# Patient Record
Sex: Male | Born: 1990 | Race: White | Hispanic: No | Marital: Single | Smoking: Current every day smoker
Health system: Southern US, Community
[De-identification: ages and names within clinical notes are randomized; demographics above are authoritative.]

## PROBLEM LIST (undated history)

## (undated) DIAGNOSIS — F909 Attention-deficit hyperactivity disorder, unspecified type: Secondary | ICD-10-CM

## (undated) DIAGNOSIS — F1994 Other psychoactive substance use, unspecified with psychoactive substance-induced mood disorder: Secondary | ICD-10-CM

## (undated) DIAGNOSIS — T7840XA Allergy, unspecified, initial encounter: Secondary | ICD-10-CM

## (undated) HISTORY — DX: Other psychoactive substance use, unspecified with psychoactive substance-induced mood disorder: F19.94

## (undated) HISTORY — DX: Allergy, unspecified, initial encounter: T78.40XA

---
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
## 2009-05-05 ENCOUNTER — Telehealth: Payer: Self-pay | Admitting: *Deleted

## 2009-05-06 ENCOUNTER — Telehealth: Payer: Self-pay | Admitting: Internal Medicine

## 2009-06-27 ENCOUNTER — Ambulatory Visit: Payer: Self-pay | Admitting: Internal Medicine

## 2009-06-27 ENCOUNTER — Telehealth: Payer: Self-pay | Admitting: Internal Medicine

## 2009-07-12 ENCOUNTER — Ambulatory Visit: Payer: Self-pay | Admitting: Internal Medicine

## 2009-07-12 ENCOUNTER — Telehealth: Payer: Self-pay | Admitting: Internal Medicine

## 2010-02-16 ENCOUNTER — Emergency Department (HOSPITAL_COMMUNITY): Admission: EM | Admit: 2010-02-16 | Discharge: 2010-02-16 | Payer: Self-pay | Admitting: Emergency Medicine

## 2010-03-08 ENCOUNTER — Ambulatory Visit (HOSPITAL_COMMUNITY): Admission: RE | Admit: 2010-03-08 | Discharge: 2010-03-08 | Payer: Self-pay | Admitting: Psychiatry

## 2010-03-21 ENCOUNTER — Ambulatory Visit (HOSPITAL_COMMUNITY): Payer: Self-pay | Admitting: Marriage and Family Therapist

## 2010-03-30 ENCOUNTER — Ambulatory Visit (HOSPITAL_COMMUNITY): Payer: Self-pay | Admitting: Marriage and Family Therapist

## 2010-04-04 ENCOUNTER — Ambulatory Visit (HOSPITAL_COMMUNITY): Payer: Self-pay | Admitting: Marriage and Family Therapist

## 2010-04-27 ENCOUNTER — Ambulatory Visit: Payer: Self-pay | Admitting: Internal Medicine

## 2010-04-27 DIAGNOSIS — R042 Hemoptysis: Secondary | ICD-10-CM | POA: Insufficient documentation

## 2010-04-27 DIAGNOSIS — J019 Acute sinusitis, unspecified: Secondary | ICD-10-CM

## 2010-06-19 ENCOUNTER — Ambulatory Visit (HOSPITAL_COMMUNITY): Payer: Self-pay | Admitting: Marriage and Family Therapist

## 2010-07-17 ENCOUNTER — Ambulatory Visit (HOSPITAL_COMMUNITY)
Admission: RE | Admit: 2010-07-17 | Discharge: 2010-07-17 | Payer: Self-pay | Source: Home / Self Care | Attending: Marriage and Family Therapist | Admitting: Marriage and Family Therapist

## 2010-08-08 NOTE — Assessment & Plan Note (Signed)
Summary: coughing up blood/deb/njr   Vital Signs:  Patient profile:   20 year old male Height:      72 inches Weight:      133 pounds BMI:     18.10 Temp:     98.2 degrees F oral Pulse rate:   60 / minute BP sitting:   120 / 70  (right arm) Cuff size:   regular  Vitals Entered By: Romualdo Bolk, CMA (AAMA) (April 27, 2010 11:20 AM) CC: Pt states that he was coughing up blood last night around 10pm. Pt is still having some coughing and congestion. Pt states that he is not having sob. A few days ago he had a sore throat. No fever.   History of Present Illness: Glen Wilson comes in today  with father for acute visit  for blodd dark in [place] last pm although ceearer to green today. Has had a chet head cold for about a week and then at work at [employer] had a left sided nose bleed that stopped in 5 monutes  without other signs .  THat evening had blood in mucous.  reports no fever or sob  or wheezing. uses daily MJ almost.  NO ha. has had chronic left sided nasal congestion for a while.   as a child used nasal steroids with ? some help . says he allergy  year round  Preventive Screening-Counseling & Management  Alcohol-Tobacco     Alcohol drinks/day: 0     Smoking Status: current     Packs/Day: 0.25  Caffeine-Diet-Exercise     Caffeine use/day: 5+     Does Patient Exercise: yes     Type of exercise: run  Current Medications (verified): 1)  None  Allergies (verified): No Known Drug Allergies  Past History:  Past medical, surgical, family and social histories (including risk factors) reviewed for relevance to current acute and chronic problems.  Past Medical History: Allergic rhinitis ADHD Underweight chronic marijuana use   Past Surgical History: Reviewed history from 02/19/2007 and no changes required. Denies surgical history  Past History:  Care Management: Dermatology: [place]  Family History: Reviewed history from 03/31/2008 and no changes  required. adopted  Social History: Reviewed history from 06/27/2009 and no changes required. school  senior at [employer] . No plans yet for next year.   sweet tea     works at [employer]    recently 14   hours .  hh of 3 dog  Fa ocass outside tobacco.  sleep  1 pm and up till 11 am.    ocass alcohol.   screen time on weekend .   Review of Systems  The patient denies anorexia, fever, weight loss, weight gain, vision loss, decreased hearing, hoarseness, chest pain, syncope, peripheral edema, headaches, abdominal pain, melena, hematochezia, severe indigestion/heartburn, transient blindness, difficulty walking, enlarged lymph nodes, and angioedema.    Physical Exam  General:  congested in nad .alert and well-hydrated.   Head:  normocephalic and atraumatic.   Eyes:  clear  no discharge    slight redness left  Ears:  R ear normal, L ear normal, and no external deformities.   Nose:  no external deformity and no external erythema.  left nostril with very swollen turbinate and mucoid discharge     obstructed and no blood or perf.  face non tender Mouth:  midl erythema  no lesions Neck:  No deformities, masses, or tenderness noted. Lungs:  Normal respiratory effort, chest expands symmetrically. Lungs are  clear to auscultation, no crackles or wheezes. Heart:  Normal rate and regular rhythm. S1 and S2 normal without gallop, murmur, click, rub or other extra sounds. Pulses:  nl cap refill  Neurologic:  non focal alert & oriented X3.   Skin:  turgor normal, color normal, no ecchymoses, and no petechiae.   Cervical Nodes:  No lymphadenopathy noted Psych:  Oriented X3, normally interactive, and good eye contact.     Impression & Recommendations:  Problem # 1:  SINUSITIS - ACUTE-NOS (ICD-461.9) left  / acute on chronic .  patient says he has chronic stuffiness for years on lthe left?  .,    very swollen tubinate today and no lesion seen otherwise .     rec see ent if continuing  obstructive signs  The  following medications were removed from the medication list:    Minocin 100 Mg Caps (Minocycline hcl) .Marland Kitchen... 1 by mouth once daily His updated medication list for this problem includes:    Amoxicillin-pot Clavulanate 875-125 Mg Tabs (Amoxicillin-pot clavulanate) .Marland Kitchen... 1 by mouth two times a day for sinsusitis  Problem # 2:  HEMOPTYSIS UNSPECIFIED (ICD-786.30) seems to be upper  airway related secondary to nose bleed previously     rec against tob MJ use  pt aware  . if continuing consider chest x ray etc.   Complete Medication List: 1)  Amoxicillin-pot Clavulanate 875-125 Mg Tabs (Amoxicillin-pot clavulanate) .Marland Kitchen.. 1 by mouth two times a day for sinsusitis  Patient Instructions: 1)  after treatment for your left sided sinusitis   if still nasally clogged on that side  then call and  we can set up ENT  appt.  2)  Saline nose spray.    to moisturize nose   in the meantime. 3)  Possible benefit  of nasal cortisone also  for chronic  rhinitis  4)  call if bloody phegm continues also. Prescriptions: AMOXICILLIN-POT CLAVULANATE 875-125 MG TABS (AMOXICILLIN-POT CLAVULANATE) 1 by mouth two times a day for sinsusitis  #20 x 0   Entered and Authorized by:   Madelin Headings MD   Signed by:   Madelin Headings MD on 04/27/2010   Method used:   Electronically to        [facility] [address] (retail)       [phone]       [place], [place]  [address]       Ph: [phone]       Fax: [phone]   RxID:   [id number]    Orders Added: 1)  Est. Patient Level IV [72536]

## 2010-08-08 NOTE — Progress Notes (Signed)
Summary: Pts mom called re: sons depression. URGENT-please triage  Phone Note Call from Patient Call back at [phone]    Caller: mom-Carol Summary of Call: Pts mom called and said that patient is very depressed. Nothing has improved.Pts mom would like a call to discuss.  Initial call taken by: Lucy Antigua,  July 12, 2009 8:18 AM  Follow-up for Phone Call        11:30 appt given.  Patient is not on med.  Has been seen recently.  No change or improvement. Follow-up by: Rudy Jew, RN,  July 12, 2009 8:53 AM

## 2010-08-08 NOTE — Assessment & Plan Note (Signed)
Summary: DEPRESSION/PS   Vital Signs:  Patient profile:   20 year old male Weight:      130 pounds Pulse rate:   66 / minute BP sitting:   100 / 60  (right arm) Cuff size:   regular  Vitals Entered By: Romualdo Bolk, CMA (AAMA) (July 12, 2009 11:44 AM) CC: Discuss depression-the other day pt was caught smoking marjuania. Pt is very unhappy and can't get thoughts together. Pt states that he is not sucidial. He can't get things together., Depression   History of Present Illness: Glen Wilson come sin today for   as an add on   emergent work in for above.    Mom is with him and states she is worried he may be depressed.  Teen  says could be also and willing to  address this.  See last  OV.   Mom looking for direction of what to do.     They do have appt with Dr Donell Beers  next week  but concerned what to do in the meantime.   Recently got caought with friends with marijuana.      Depression History:      The patient is having a depressed mood most of the day and has a diminished interest in his usual daily activities.  Positive alarm features for depression include feelings of worthlessness (guilt) and impaired concentration (indecisiveness).  However, he denies significant weight loss, significant weight gain, insomnia, hypersomnia, psychomotor agitation, psychomotor retardation, fatigue (loss of energy), and recurrent thoughts of death or suicide.  Positive alarm features for a manic disorder include persistently & abnormally elevated mood, abnormally & persistently irritable mood, talkative or feels need to keep talking, and distractibility.  He denies less need for sleep, flight of ideas, increase in goal-directed activity, psychomotor agitation, inflated self-esteem or grandiosity, excessive buying sprees, excessive sexual indiscretions, and excessive foolish business investments.        Suicide risk questions reveal that he does not feel like life is worth living.  The patient denies  that he wishes that he were dead and denies that he has thought about ending his life.         Preventive Screening-Counseling & Management  Alcohol-Tobacco     Alcohol drinks/day: 0     Smoking Status: current     Packs/Day: 0.25  Caffeine-Diet-Exercise     Caffeine use/day: 5+     Does Patient Exercise: yes     Type of exercise: run  Current Medications (verified): 1)  Minocin 100 Mg Caps (Minocycline Hcl) .Marland Kitchen.. 1 By Mouth Once Daily 2)  Tazorac 0.05 % Gel (Tazarotene)  Allergies (verified): No Known Drug Allergies  Past History:  Past medical, surgical, family and social histories (including risk factors) reviewed, and no changes noted (except as noted below).  Past Medical History: Reviewed history from 06/27/2009 and no changes required. Allergic rhinitis ADHD Underweight  Past Surgical History: Reviewed history from 02/19/2007 and no changes required. Denies surgical history  Family History: Reviewed history from 03/31/2008 and no changes required. adopted  Social History: Reviewed history from 06/27/2009 and no changes required. school  senior at [employer] . No plans yet for next year.   sweet tea     works at [employer]    recently 14   hours .  hh of 3 dog  Fa ocass ouside tobacco.  sleep  1 pm and up till 11 am.    ocass alcohol.   screen time on  weekend .   Review of Systems  The patient denies anorexia, fever, weight loss, chest pain, syncope, dyspnea on exertion, peripheral edema, prolonged cough, abdominal pain, melena, hematochezia, severe indigestion/heartburn, difficulty walking, unusual weight change, abnormal bleeding, enlarged lymph nodes, and angioedema.         see previous note   Physical Exam  General:  Well-developed,well-nourished,in no acute distress; alert,appropriate and cooperative throughout examination Head:  normocephalic and atraumatic.   Pulses:  nl cap refill  Neurologic:  non focal and no tremors Psych:  Oriented X3, normally  interactive, good eye contact, and not anxious appearing.     Impression & Recommendations:  Problem # 1:  ? of ADJ DISORDER WITH MIXED ANXIETY & DEPRESSED MOOD (ICD-309.28) SAt rsisk and disc  my rec of seeing psychiatry for med consult .     however  if wishes in the mean time can add  samples of lexapro with risk discussed   .  rx for adhd may also help but there is comorbid dx possible.      Also  rec make a list of various meds he has been on over the years and perceptino of efficacy and side effect profile., this should  expedite appropriate choise of intervention.   Problem # 2:  ? of BEHAVIOR PROBLEM, ADULT (ICD-V40.9) see above   Problem # 3:  ADHD (ICD-314.01) not on meds   and beginning school next week . says daytrana no hlp in past but mom feels it helped.    Complete Medication List: 1)  Minocin 100 Mg Caps (Minocycline hcl) .Marland Kitchen.. 1 by mouth once daily 2)  Tazorac 0.05 % Gel (Tazarotene) 3)  Lexapro 10 Mg Tabs (Escitalopram oxalate) .Marland Kitchen.. 1 by mouth   as directed.  Patient Instructions: 1)  keep appt next week. 2)  if you wish can start lexapro 10 mg  take 1/2 by mouth once daily and increase to 1 by mouth once daily  . 3)  call if  needed.   Psychiatrist may change medication  . greater than 50% of visit spent in counseling  and face to face.  30 minutes .

## 2010-09-22 LAB — BASIC METABOLIC PANEL
BUN: 9 mg/dL (ref 6–23)
Creatinine, Ser: 0.96 mg/dL (ref 0.4–1.5)
GFR calc Af Amer: >60 mL/min (ref >60)
GFR calc non Af Amer: >60 mL/min (ref >60)
Potassium: 3.7 mEq/L (ref 3.5–5.1)

## 2010-09-22 LAB — DIFFERENTIAL
Basophils Absolute: 0 10*3/uL (ref 0.0–0.1)
Lymphocytes Relative: 39 % (ref 12–46)
Lymphs Abs: 2.2 10*3/uL (ref 0.7–4.0)
Neutro Abs: 2.7 10*3/uL (ref 1.7–7.7)
Neutrophils Relative %: 50 % (ref 43–77)

## 2010-09-22 LAB — TRICYCLICS SCREEN, URINE: TCA Scrn: NOT DETECTED

## 2010-09-22 LAB — CBC
Platelets: 245 10*3/uL (ref 150–400)
RDW: 11.9 % (ref 11.5–15.5)
WBC: 5.5 10*3/uL (ref 4.0–10.5)

## 2010-09-22 LAB — RAPID URINE DRUG SCREEN, HOSP PERFORMED: Amphetamines: NOT DETECTED

## 2010-09-22 LAB — ETHANOL: Alcohol, Ethyl (B): <5 mg/dL (ref 0–10)

## 2011-09-29 ENCOUNTER — Emergency Department (HOSPITAL_COMMUNITY)
Admission: EM | Admit: 2011-09-29 | Discharge: 2011-09-29 | Disposition: A | Discharge status: Home / Self Care | Payer: No Typology Code available for payment source | Attending: Emergency Medicine | Admitting: Emergency Medicine

## 2011-09-29 ENCOUNTER — Encounter (HOSPITAL_COMMUNITY): Payer: Self-pay

## 2011-09-29 ENCOUNTER — Emergency Department (HOSPITAL_COMMUNITY): Payer: No Typology Code available for payment source

## 2011-09-29 DIAGNOSIS — S161XXA Strain of muscle, fascia and tendon at neck level, initial encounter: Secondary | ICD-10-CM

## 2011-09-29 DIAGNOSIS — F1210 Cannabis abuse, uncomplicated: Secondary | ICD-10-CM | POA: Insufficient documentation

## 2011-09-29 DIAGNOSIS — S139XXA Sprain of joints and ligaments of unspecified parts of neck, initial encounter: Secondary | ICD-10-CM | POA: Insufficient documentation

## 2011-09-29 DIAGNOSIS — M25519 Pain in unspecified shoulder: Secondary | ICD-10-CM | POA: Insufficient documentation

## 2011-09-29 DIAGNOSIS — Y92410 Unspecified street and highway as the place of occurrence of the external cause: Secondary | ICD-10-CM | POA: Insufficient documentation

## 2011-09-29 DIAGNOSIS — R079 Chest pain, unspecified: Secondary | ICD-10-CM | POA: Insufficient documentation

## 2011-09-29 DIAGNOSIS — M549 Dorsalgia, unspecified: Secondary | ICD-10-CM | POA: Insufficient documentation

## 2011-09-29 DIAGNOSIS — M542 Cervicalgia: Secondary | ICD-10-CM | POA: Insufficient documentation

## 2011-09-29 HISTORY — DX: Attention-deficit hyperactivity disorder, unspecified type: F90.9

## 2011-09-29 MED ORDER — CYCLOBENZAPRINE HCL 10 MG PO TABS: 10.0000 mg | ORAL_TABLET | Freq: Two times a day (BID) | ORAL | Status: AC | PRN

## 2011-09-29 MED ORDER — IBUPROFEN 600 MG PO TABS: 600.0000 mg | ORAL_TABLET | Freq: Four times a day (QID) | ORAL | Status: AC | PRN
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
## 2011-09-29 NOTE — ED Provider Notes (Signed)
Medical screening examination/treatment/procedure(s) were performed by non-physician practitioner and as supervising physician I was immediately available for consultation/collaboration.   Josuha Fontanez, MD 09/29/11 2308 
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
## 2012-02-20 ENCOUNTER — Ambulatory Visit: Payer: BC Managed Care – PPO

## 2012-04-05 ENCOUNTER — Encounter (HOSPITAL_BASED_OUTPATIENT_CLINIC_OR_DEPARTMENT_OTHER): Payer: Self-pay | Admitting: *Deleted

## 2012-04-05 ENCOUNTER — Emergency Department (HOSPITAL_BASED_OUTPATIENT_CLINIC_OR_DEPARTMENT_OTHER)
Admission: EM | Admit: 2012-04-05 | Discharge: 2012-04-06 | Disposition: A | Discharge status: Home / Self Care | Payer: BC Managed Care – PPO | Attending: Emergency Medicine | Admitting: Emergency Medicine

## 2012-04-05 DIAGNOSIS — S61209A Unspecified open wound of unspecified finger without damage to nail, initial encounter: Secondary | ICD-10-CM | POA: Insufficient documentation

## 2012-04-05 DIAGNOSIS — F17200 Nicotine dependence, unspecified, uncomplicated: Secondary | ICD-10-CM | POA: Insufficient documentation

## 2012-04-05 DIAGNOSIS — F909 Attention-deficit hyperactivity disorder, unspecified type: Secondary | ICD-10-CM | POA: Insufficient documentation

## 2012-04-05 DIAGNOSIS — W268XXA Contact with other sharp object(s), not elsewhere classified, initial encounter: Secondary | ICD-10-CM | POA: Insufficient documentation

## 2012-04-05 DIAGNOSIS — S61019A Laceration without foreign body of unspecified thumb without damage to nail, initial encounter: Secondary | ICD-10-CM

## 2012-04-05 NOTE — ED Provider Notes (Signed)
History  This chart was scribed for Glen Medaglia Smitty Cords, MD by Shari Heritage. The patient was seen in room MH05/MH05. Patient's care was started at 2157.     CSN: [id number]  Arrival date & time 04/05/12  2157   First MD Initiated Contact with Patient 04/05/12 2358      Chief Complaint  Patient presents with  . Laceration     Patient is a 21 y.o. male presenting with skin laceration. The history is provided by the patient. No language interpreter was used.  Laceration  The incident occurred 3 to 5 hours ago. The laceration is located on the right hand. The laceration is 3 cm (3 cm) in size. The laceration mechanism was a broken glass. The pain is moderate. The pain has been constant since onset. He reports no foreign bodies present. His tetanus status is UTD.    HPI Comments: Glen Wilson is a 21 y.o. male who presents to the Emergency Department complaining of a laceration with associated moderate, constant pain to right thumb onset 3-4 hours ago. Patient states that he was washing dishes when he cut his thumb on broken glass. Patient applied dressing to the wound PTA. He denies any other symptoms at this time. Patient's last Tdap was at 17 years when he was accepted to college. He is a current everyday smoker.  Past Medical History  Diagnosis Date  . Attention deficit disorder with hyperactivity     History reviewed. No pertinent past surgical history.  History reviewed. No pertinent family history.  History  Substance Use Topics  . Smoking status: Current Every Day Smoker  . Smokeless tobacco: Not on file  . Alcohol Use: Yes      Review of Systems  Skin: Positive for wound.  All other systems reviewed and are negative.    Allergies  Review of patient's allergies indicates no known allergies.  Home Medications   Current Outpatient Rx  Name Route Sig Dispense Refill  . TETRAHYDROZOLINE HCL 0.05 % OP SOLN Both Eyes Place 1 drop into both eyes 2 (two) times  daily.      BP 126/70  Pulse 86  Temp 98.3 F (36.8 C) (Oral)  Resp 16  Ht 6\' 2"  (1.88 m)  Wt 130 lb (58.968 kg)  BMI 16.69 kg/m2  Physical Exam  Constitutional: He is oriented to person, place, and time. He appears well-developed and well-nourished.  HENT:  Head: Normocephalic and atraumatic.  Mouth/Throat: Oropharynx is clear and moist. No oropharyngeal exudate.  Eyes: EOM are normal. Pupils are equal, round, and reactive to light.  Neck: Normal range of motion. Neck supple.  Cardiovascular: Normal rate, regular rhythm and normal heart sounds.   No murmur heard. Pulmonary/Chest: Effort normal and breath sounds normal. No respiratory distress. He has no wheezes. He has no rales.  Abdominal: Soft. Bowel sounds are normal. There is no tenderness.  Musculoskeletal:       Right hand: He exhibits laceration. normal sensation noted.       Hands:      3 cm vertical laceration with flap, on tuft  Neurological: He is alert and oriented to person, place, and time.  Psychiatric: He has a normal mood and affect. His behavior is normal.    ED Course  Procedures (including critical care time) COORDINATION OF CARE: 12:03am- Patient informed of current plan for treatment and evaluation and agrees with plan at this time.  Patient tolerance: Patient tolerated the procedure well with no immediate complications.  Labs Reviewed - No data to display  Dg Finger Thumb Right  04/06/2012  *RADIOLOGY REPORT*  Clinical Data: Cut right thumb with glass.  RIGHT THUMB 2+V  Comparison: None.  Findings: Three views of the right thumb were obtained.  There is no evidence for a radiopaque foreign body.  Negative for acute fracture or dislocation.   Soft tissue abnormality along the tip of the thumb.  IMPRESSION: Soft tissue abnormality along the tip of the thumb is most compatible with the area of laceration.  No definite radiopaque foreign body.  No acute bony abnormality.   Original Report  Authenticated By: Richarda Overlie, M.D.      No diagnosis found.    MDM  LACERATION REPAIR Performed by: Jasmine Awe Authorized by: Jasmine Awe Consent: Verbal consent obtained. Risks and benefits: risks, benefits and alternatives were discussed Consent given by: patient Patient identity confirmed: provided demographic data Prepped and Draped in normal sterile fashion Wound explored  Laceration Location: medial right thumb  Laceration Length: 3 cm  No Foreign Bodies seen or palpated  Anesthesia: local infiltration  Local anesthetic: lidocaine 2%   Anesthetic total: 3 ml digital block  Irrigation method: syringe Amount of cleaning: extensive  Skin closure: 5.0 ethilon  Number of sutures: 10  Technique: interrupted, complex trimming of skin and undermining of edges  Patient tolerance: Patient tolerated the procedure well with no immediate complications.  Return for streaking bleeding or any complications.   I personally performed the services described in this documentation, which was scribed in my presence. The recorded information has been reviewed and considered.     Yoanna Jurczyk Smitty Cords, MD 04/06/12 0157

## 2012-04-05 NOTE — ED Notes (Signed)
Pt states he cut his right thumb on a glass while washing dishes.. Pressure dressing applied PTA. Bleeding controlled.

## 2012-04-06 ENCOUNTER — Emergency Department (HOSPITAL_BASED_OUTPATIENT_CLINIC_OR_DEPARTMENT_OTHER): Payer: BC Managed Care – PPO

## 2012-04-06 MED ORDER — TRAMADOL HCL 50 MG PO TABS: 50.0000 mg | ORAL_TABLET | Freq: Four times a day (QID) | ORAL | Status: DC | PRN

## 2012-04-06 NOTE — ED Notes (Signed)
MD at bedside. 

## 2012-04-06 NOTE — ED Notes (Signed)
Peroxide/NS soak x 10 minutes per Dr. Magdalene Molly.

## 2012-04-06 NOTE — ED Notes (Signed)
D/c home with ride and rx x 1 for tramadol

## 2012-04-17 ENCOUNTER — Ambulatory Visit: Payer: BC Managed Care – PPO | Admitting: Internal Medicine

## 2012-04-21 ENCOUNTER — Encounter: Payer: Self-pay | Admitting: Internal Medicine

## 2012-04-21 ENCOUNTER — Ambulatory Visit (INDEPENDENT_AMBULATORY_CARE_PROVIDER_SITE_OTHER): Payer: BC Managed Care – PPO | Admitting: Internal Medicine

## 2012-04-21 VITALS — BP 116/84 | HR 74 | Temp 98.7°F | Wt 131.0 lb

## 2012-04-21 DIAGNOSIS — S61219A Laceration without foreign body of unspecified finger without damage to nail, initial encounter: Secondary | ICD-10-CM

## 2012-04-21 DIAGNOSIS — S61209A Unspecified open wound of unspecified finger without damage to nail, initial encounter: Secondary | ICD-10-CM

## 2012-04-21 DIAGNOSIS — Z4802 Encounter for removal of sutures: Secondary | ICD-10-CM

## 2012-04-21 NOTE — Progress Notes (Signed)
  Subjective:    Patient ID: Glen Wilson, male    DOB: 1990-07-10, 21 y.o.   MRN: [id number]  HPI Pt comesin today with fatehr for suture remopval and wound evaluation. Last visit was 2 years ago  About 16 days ago glass cut right thumb trying to catch it, had 10 sutures placed . No other injury.  No sig numbness except at tip no bleeding or dc. 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   Review of Systems No fever swelling    Objective:   Physical Exam BP 116/84  Pulse 74  Temp 98.7 F (37.1 C) (Oral)  Wt 131 lb (59.421 kg)  SpO2 99% WDWN in nad   Alert  Right thumb with healling laceration without infection some crusting at tip . No redness  And no tenderness   10 sutures removed without complication or bleeding dressed with antibiotic ointment and  Bandaid.      Assessment & Plan:   fingetr laceration  Suture removal wound check.   Expectant management. Disc injury prevention etc .

## 2012-04-21 NOTE — Patient Instructions (Addendum)
keep wound clean gently  Antibiotic ointment ok .  Recheck as needed or when due

## 2012-07-22 NOTE — Progress Notes (Signed)
This encounter was created in error - please disregard.

## 2013-03-27 IMAGING — CR DG FINGER THUMB 2+V*R*
3 series · 3 of 3 positions shown · non-contrast
Comparison: None.

CLINICAL DATA: Cut right thumb with glass.

RIGHT THUMB 2+V

[x finger pa right]
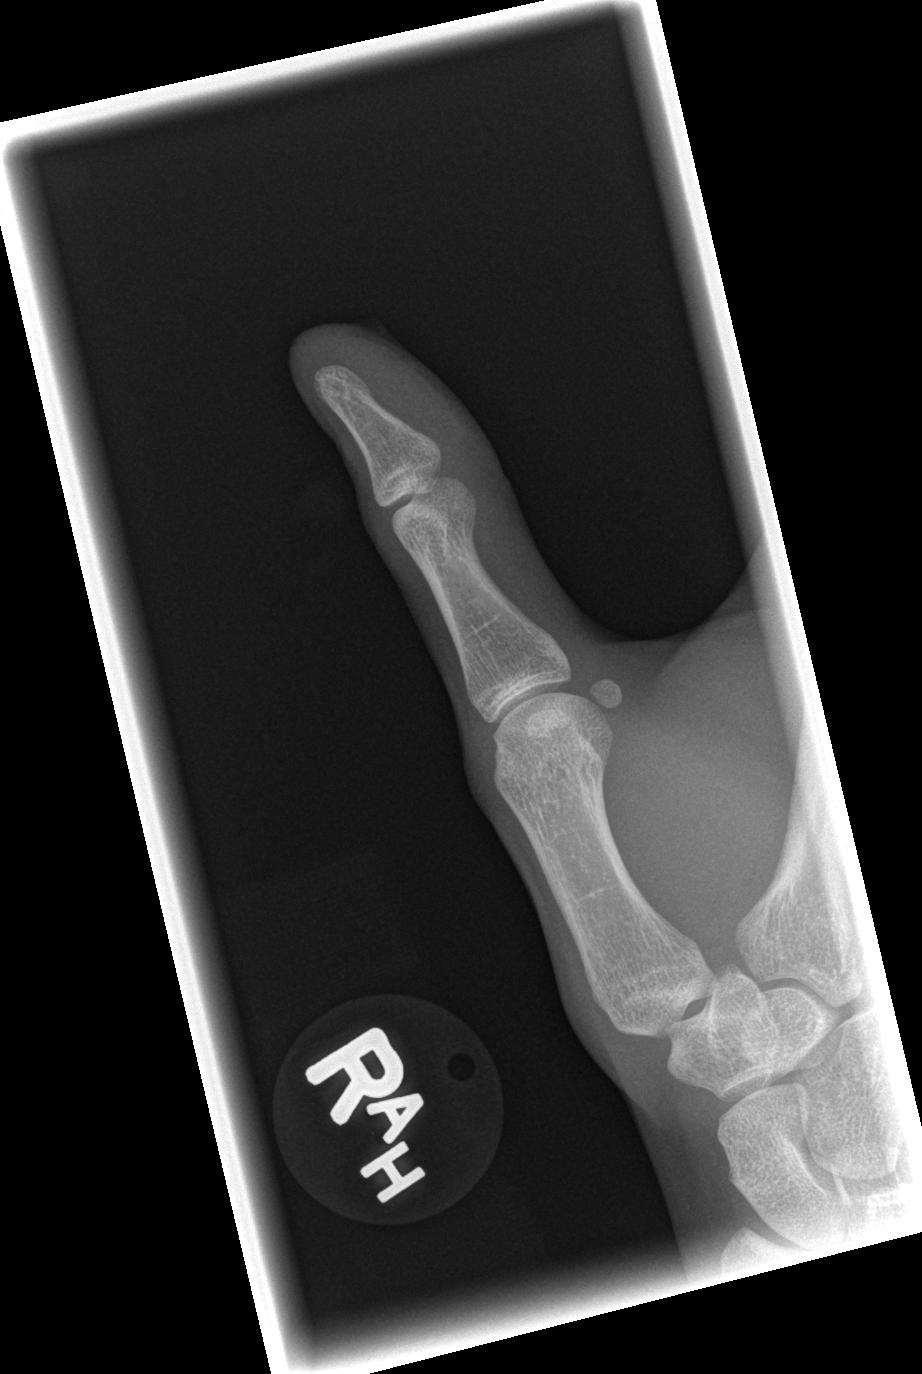

[x finger obl. right]
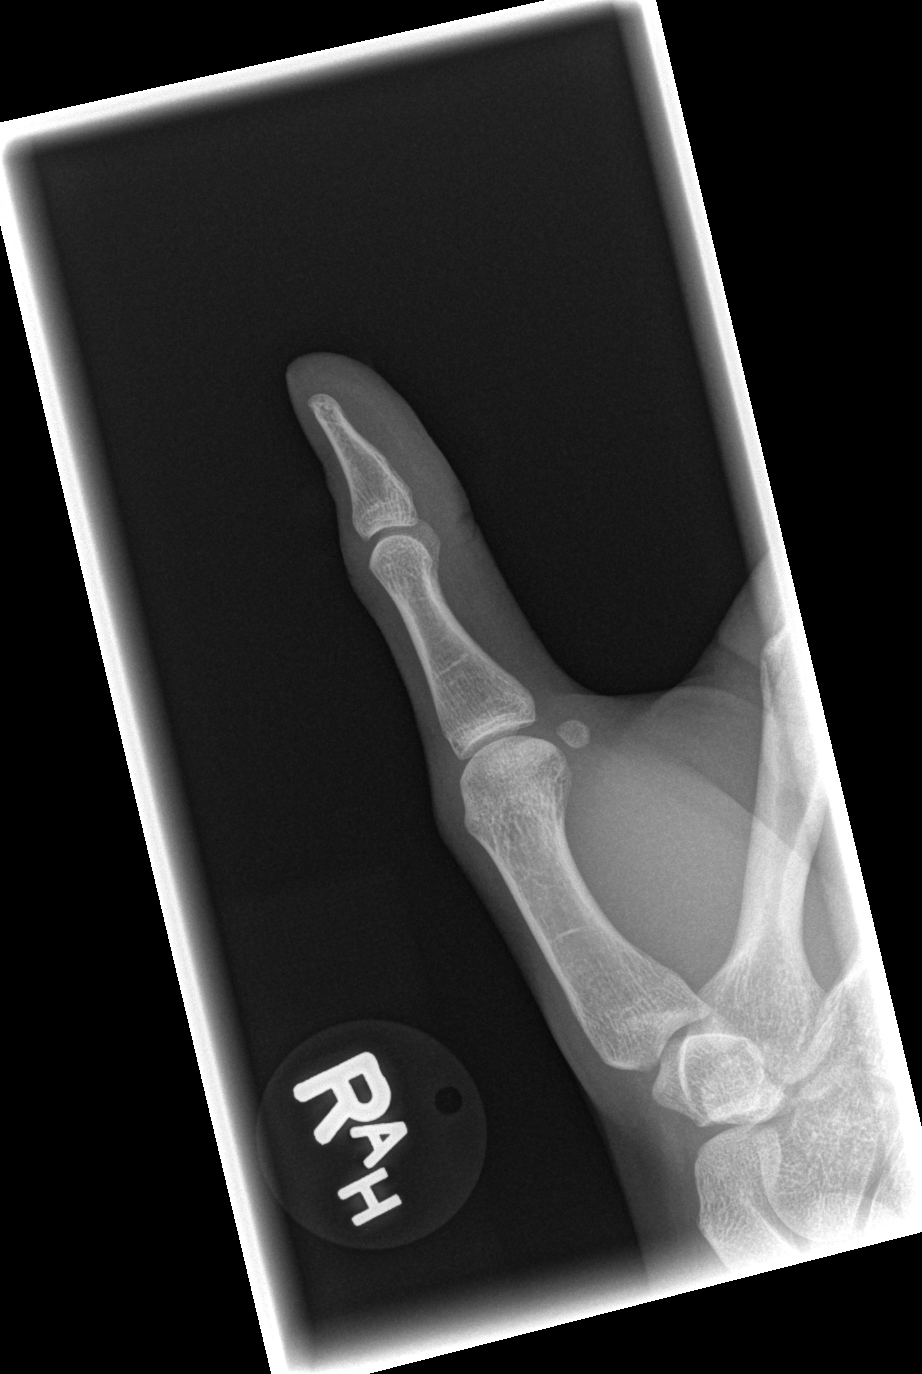

[x finger lateral right]
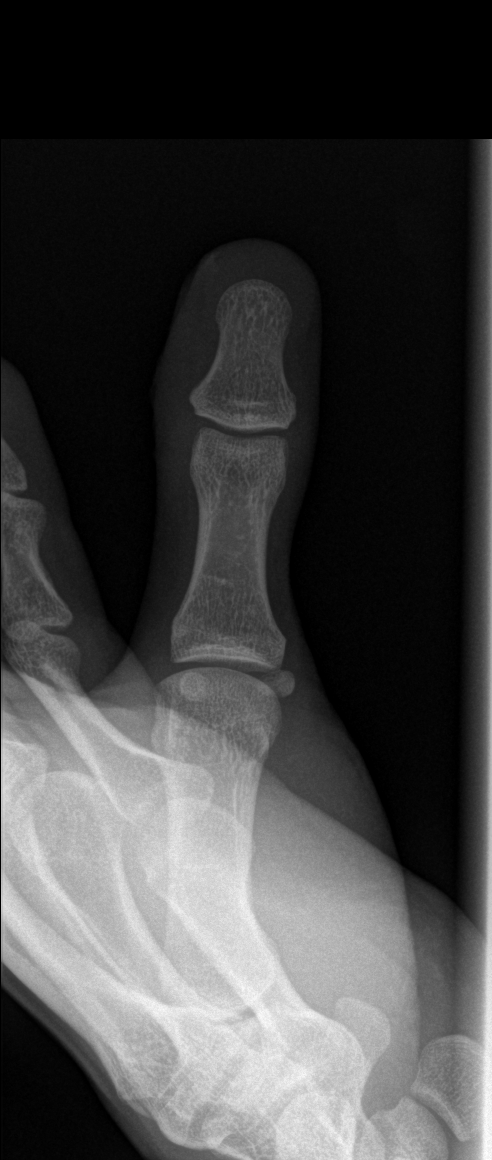

[3 of 3 positions shown; findings below may reference images not displayed]

FINDINGS: Three views of the right thumb were obtained.  There is
no evidence for a radiopaque foreign body.  Negative for acute
fracture or dislocation.   Soft tissue abnormality along the tip of
the thumb.
IMPRESSION: Soft tissue abnormality along the tip of the thumb is most
compatible with the area of laceration.  No definite radiopaque
foreign body.

No acute bony abnormality.

## 2013-09-02 ENCOUNTER — Other Ambulatory Visit: Payer: BC Managed Care – PPO

## 2013-09-04 ENCOUNTER — Other Ambulatory Visit: Payer: BC Managed Care – PPO

## 2013-09-07 ENCOUNTER — Other Ambulatory Visit (INDEPENDENT_AMBULATORY_CARE_PROVIDER_SITE_OTHER): Payer: BC Managed Care – PPO

## 2013-09-07 DIAGNOSIS — Z0000 Encounter for general adult medical examination without abnormal findings: Secondary | ICD-10-CM

## 2013-09-07 LAB — CBC WITH DIFFERENTIAL/PLATELET
Basophils Absolute: 0 10*3/uL (ref 0.0–0.1)
Basophils Relative: 0.4 % (ref 0.0–3.0)
EOS PCT: 3.4 % (ref 0.0–5.0)
Eosinophils Absolute: 0.2 10*3/uL (ref 0.0–0.7)
HCT: 49 % (ref 39.0–52.0)
Hemoglobin: 16.3 g/dL (ref 13.0–17.0)
Lymphocytes Relative: 47.7 % — ABNORMAL HIGH (ref 12.0–46.0)
Lymphs Abs: 3.1 10*3/uL (ref 0.7–4.0)
MCHC: 33.3 g/dL (ref 30.0–36.0)
MCV: 98.3 fl (ref 78.0–100.0)
MONO ABS: 0.6 10*3/uL (ref 0.1–1.0)
Monocytes Relative: 9.4 % (ref 3.0–12.0)
NEUTROS PCT: 39.1 % — AB (ref 43.0–77.0)
Neutro Abs: 2.5 10*3/uL (ref 1.4–7.7)
PLATELETS: 263 10*3/uL (ref 150.0–400.0)
RBC: 4.98 Mil/uL (ref 4.22–5.81)
RDW: 13.4 % (ref 11.5–14.6)
WBC: 6.5 10*3/uL (ref 4.5–10.5)

## 2013-09-07 LAB — HEPATIC FUNCTION PANEL
ALBUMIN: 4 g/dL (ref 3.5–5.2)
ALK PHOS: 66 U/L (ref 39–117)
ALT: 22 U/L (ref 0–53)
AST: 23 U/L (ref 0–37)
Bilirubin, Direct: 0.1 mg/dL (ref 0.0–0.3)
TOTAL PROTEIN: 7.2 g/dL (ref 6.0–8.3)
Total Bilirubin: 0.7 mg/dL (ref 0.3–1.2)

## 2013-09-07 LAB — LIPID PANEL
CHOLESTEROL: 128 mg/dL (ref 0–200)
HDL: 49.6 mg/dL (ref >39.00)
LDL CALC: 64 mg/dL (ref 0–99)
TRIGLYCERIDES: 71 mg/dL (ref 0.0–149.0)
Total CHOL/HDL Ratio: 3
VLDL: 14.2 mg/dL (ref 0.0–40.0)

## 2013-09-07 LAB — BASIC METABOLIC PANEL
BUN: 16 mg/dL (ref 6–23)
CALCIUM: 9.3 mg/dL (ref 8.4–10.5)
CHLORIDE: 105 meq/L (ref 96–112)
CO2: 27 mEq/L (ref 19–32)
CREATININE: 1.1 mg/dL (ref 0.4–1.5)
GFR: 91.74 mL/min (ref >60.00)
Glucose, Bld: 84 mg/dL (ref 70–99)
Potassium: 3.6 mEq/L (ref 3.5–5.1)
Sodium: 139 mEq/L (ref 135–145)

## 2013-09-07 LAB — TSH: TSH: 1.12 u[IU]/mL (ref 0.35–5.50)

## 2013-09-08 ENCOUNTER — Encounter: Payer: Self-pay | Admitting: Internal Medicine

## 2013-09-08 ENCOUNTER — Ambulatory Visit (INDEPENDENT_AMBULATORY_CARE_PROVIDER_SITE_OTHER): Payer: BC Managed Care – PPO | Admitting: Internal Medicine

## 2013-09-08 VITALS — BP 126/72 | Temp 97.8°F | Ht 72.5 in | Wt 139.0 lb

## 2013-09-08 DIAGNOSIS — Z113 Encounter for screening for infections with a predominantly sexual mode of transmission: Secondary | ICD-10-CM

## 2013-09-08 DIAGNOSIS — J069 Acute upper respiratory infection, unspecified: Secondary | ICD-10-CM

## 2013-09-08 DIAGNOSIS — Z0000 Encounter for general adult medical examination without abnormal findings: Secondary | ICD-10-CM

## 2013-09-08 DIAGNOSIS — B9789 Other viral agents as the cause of diseases classified elsewhere: Secondary | ICD-10-CM

## 2013-09-08 NOTE — Patient Instructions (Addendum)
Working memory can be permanently  affected by chornic thc exposure  Binge drinking is also unhealthy . Will notify you  of labs when available.  Marijuana Abuse and Chemical Dependency WHEN IS DRUG USE A PROBLEM? Problems related to drug use usually begin with abuse of the substance and lead to dependency.  Abuse is repeated use of a drug with recurrent and significant negative consequences. Abuse happens anytime drug use is interfering with normal living activities including:   Failure to fulfill major obligations at work, school or home (poor work Systems analyst, missing work or school and/or neglecting children and home).  Engaging in activities that are physically dangerous (driving a car or doing recreational activities such as swimming or rock climbing) while under the effects of the drug.  Recurrent drug-related legal problems (arrests for disorderly conduct or assault and battery).  Recurrent social or interpersonal problems caused or increased by the effects of the drug (arguments with family or friends, or physical fights). Dependency has two parts.   You first develop an emotional/psychological dependence. Psychological dependence develops when your mind tells you that the drug is needed. You come to believe it helps you cope with life.  This is usually followed by physical dependence which has developed when continuing increases of drugs are required to get the same feeling or "high." This may result in:  Withdrawal symptoms such as shakes or tremors.  The substance being over a longer period of time than intended.  An ongoing desire, or unsuccessful effort to, cut down or control the use.  Greater amounts of time spent getting the drug, using the drug or recovering from the effects of the drug.  Important social, work or interests and activities are given up or reduced because or drug use.  Substance is used despite knowledge of ongoing physical (ulcers) or psychological  (depression) problems. SIGNS OF CHEMICAL DEPENDENCY:  Friends or family say there is a problem.  Fighting when using drugs.  Having blackouts (not remembering what you do while using).  Feel sick from using drugs but continue using.  Lie about use or amounts of drugs used.  Need drugs to get you going.  Need drugs to relate to people or feel comfortable in social situations.  Use drugs to forget problems. A "yes" answered to any of the above signs of chemical dependency indicates there are problems. The longer the use of drugs continues, the greater the problems will become. If there is a family history of drug or alcohol use it is best not to experiment with drugs. Experimentation leads to tolerance. Addiction is followed by dependency where drugs are now needed not just to get high but to feel normal. Addiction cannot be cured but it can be stopped. This often requires outside help and the care of professionals. Treatment centers are listed in the yellow pages under: Cocaine, Narcotics, and Alcoholics anonymous. Most hospitals and clinics can refer you to a specialized care center. WHAT IS MARIJUANA? Marijuana is a plant which grows wild all over the world. The plant contains many chemicals but the active ingredient of the plant is THC (tetrahydrocannabinol). This is responsible for the "high" perceived by people using the drug. HOW IS MARIJUANA USED? Marijuana is smoked, eaten in brownies or any other food, and drank as a tea. WHAT ARE THE EFFECTS OF MARIJUANA? Marijuana is a nervous system depressant which slows the thinking process. Because of this effect, users think marijuana has a calming effect. Actually what happens is the air  carrying tubules in the lung become relaxed and allow more oxygen to enter. This causes the user to feel high. The blood pressure falls so less blood reaches the brain and the heart speeds up. As the effects wear off the user becomes depressed. Some people  become very paranoid during use. They feel as though people are out to get them. Periodic use can interfere with performance at school or work. Generally Marijuana use does not develop into a physical dependence, but it is very habit forming. Marijuana is also seen as a gateway to use of harder drugs. Strong habits such as using Marijuana, as with all drugs and addictions, can only be helped by stopping use of all chemicals. This is hard but may save your life.  OTHER HEALTH RISKS OF MARIJUANA AND DRUG USE ARE: The increased possibility of getting AIDS or hepatitis (liver inflammation).  HOW TO STAY DRUG FREE ONCE YOU HAVE QUIT USING:  Develop healthy activities and form friends who do not use drugs.  Stay away from the drug scene.  Tell the those who want you to use drugs you have other, better things to do.  Have ready excuses available about why you cannot use.  Attend 12-Step Meetings for support from other recovering people. FOR MORE HELP OR INFORMATION CONTACT YOUR LOCAL CAREGIVER, CLINIC, [place]. Document Released: 06/22/2000 Document Revised: 10/20/2012 Document Reviewed: 07/23/2007 [facility] Patient Information 2014 [place]. Marijuana Abuse Your exam shows you have used marijuana or pot. There are many health problems related to marijuana abuse. These include:  Bronchitis.  Chronic cough.  Emphysema.  Lung and upper airway cancer. Abusers also experience impairment in:  Memory.  Judgment.  Ability to learn.  Coordination. Students who smoke marijuana:  Get lower grades.  Are less likely to graduate than those who do not. Adults who abuse marijuana:  Have problems at work.  May even lose their jobs due to:  Poor work Systems analyst.  Absenteeism. Attention, memory, and learning skills have been shown to be diminished for up to 6 months after stopping regular use, and there is evidence that the effects can be cumulative over a lifetime.  Heavier use  of marijuana also puts a strain on relationships with friends and loved ones and can lead to moodiness and loss of confidence. Acute intoxication can lead to:  Increased anxiety.  A panic episode. It also increases the risk for having an automobile accident. This is especially true if the pot is combined with alcohol or other intoxicants. Treatment for acute intoxication is rarely needed. However, medicine to reduce anxiety may be helpful in some people. Millions of people are considered to be dependent on marijuana. It is long-term regular use that leads to addiction and all of its complex problems. Information on the problem of addiction and the health problems of long-term abuse is posted at the [facility] for Drug Abuse website, motorcyclefax.com. Consult with your doctor or counselor if you want further information and support in handling this common problem. Document Released: 08/02/2004 Document Revised: 09/17/2011 Document Reviewed: 05/20/2007 [facility] Patient Information 2014 [employer], [place].   Preventive Care for Adults, Male A healthy lifestyle and preventive care can promote health and wellness. Preventive health guidelines for men include the following key practices:  A routine yearly physical is a good way to check with your health care provider about your health and preventative screening. It is a chance to share any concerns and updates on your health and to receive a thorough exam.  Visit your dentist for a routine exam and preventative care every 6 months. Brush your teeth twice a day and floss once a day. Good oral hygiene prevents tooth decay and gum disease.  The frequency of eye exams is based on your age, health, family medical history, use of contact lenses, and other factors. Follow your health care provider's recommendations for frequency of eye exams.  Eat a healthy diet. Foods such as vegetables, fruits, whole grains, low-fat dairy products, and lean protein  foods contain the nutrients you need without too many calories. Decrease your intake of foods high in solid fats, added sugars, and salt. Eat the right amount of calories for you.Get information about a proper diet from your health care provider, if necessary.  Regular physical exercise is one of the most important things you can do for your health. Most adults should get at least 150 minutes of moderate-intensity exercise (any activity that increases your heart rate and causes you to sweat) each week. In addition, most adults need muscle-strengthening exercises on 2 or more days a week.  Maintain a healthy weight. The body mass index (BMI) is a screening tool to identify possible weight problems. It provides an estimate of body fat based on height and weight. Your health care provider can find your BMI and can help you achieve or maintain a healthy weight.For adults 20 years and older:  A BMI below 18.5 is considered underweight.  A BMI of 18.5 to 24.9 is normal.  A BMI of 25 to 29.9 is considered overweight.  A BMI of 30 and above is considered obese.  Maintain normal blood lipids and cholesterol levels by exercising and minimizing your intake of saturated fat. Eat a balanced diet with plenty of fruit and vegetables. Blood tests for lipids and cholesterol should begin at age 5 and be repeated every 5 years. If your lipid or cholesterol levels are high, you are over 50, or you are at high risk for heart disease, you may need your cholesterol levels checked more frequently.Ongoing high lipid and cholesterol levels should be treated with medicines if diet and exercise are not working.  If you smoke, find out from your health care provider how to quit. If you do not use tobacco, do not start.  Lung cancer screening is recommended for adults aged 64 80 years who are at high risk for developing lung cancer because of a history of smoking. A yearly low-dose CT scan of the lungs is recommended for  people who have at least a 30-pack-year history of smoking and are a current smoker or have quit within the past 15 years. A pack year of smoking is smoking an average of 1 pack of cigarettes a day for 1 year (for example: 1 pack a day for 30 years or 2 packs a day for 15 years). Yearly screening should continue until the smoker has stopped smoking for at least 15 years. Yearly screening should be stopped for people who develop a health problem that would prevent them from having lung cancer treatment.  If you choose to drink alcohol, do not have more than 2 drinks per day. One drink is considered to be 12 ounces (355 mL) of beer, 5 ounces (148 mL) of wine, or 1.5 ounces (44 mL) of liquor.  Avoid use of street drugs. Do not share needles with anyone. Ask for help if you need support or instructions about stopping the use of drugs.  High blood pressure causes heart disease and increases  the risk of stroke. Your blood pressure should be checked at least every 1 2 years. Ongoing high blood pressure should be treated with medicines, if weight loss and exercise are not effective.  If you are 68 24 years old, ask your health care provider if you should take aspirin to prevent heart disease.  Diabetes screening involves taking a blood sample to check your fasting blood sugar level. This should be done once every 3 years, after age 38, if you are within normal weight and without risk factors for diabetes. Testing should be considered at a younger age or be carried out more frequently if you are overweight and have at least 1 risk factor for diabetes.  Colorectal cancer can be detected and often prevented. Most routine colorectal cancer screening begins at the age of 62 and continues through age 70. However, your health care provider may recommend screening at an earlier age if you have risk factors for colon cancer. On a yearly basis, your health care provider may provide home test kits to check for hidden blood  in the stool. Use of a small camera at the end of a tube to directly examine the colon (sigmoidoscopy or colonoscopy) can detect the earliest forms of colorectal cancer. Talk to your health care provider about this at age 33, when routine screening begins. Direct exam of the colon should be repeated every 5 10 years through age 64, unless early forms of precancerous polyps or small growths are found.  People who are at an increased risk for hepatitis B should be screened for this virus. You are considered at high risk for hepatitis B if:  You were born in a country where hepatitis B occurs often. Talk with your health care provider about which countries are considered high-risk.  Your parents were born in a high-risk country and you have not received a shot to protect against hepatitis B (hepatitis B vaccine).  You have HIV or AIDS.  You use needles to inject street drugs.  You live with, or have sex with, someone who has hepatitis B.  You are a man who has sex with other men (MSM).  You get hemodialysis treatment.  You take certain medicines for conditions such as cancer, organ transplantation, and autoimmune conditions.  Hepatitis C blood testing is recommended for all people born from [birth year] through 1966 and any individual with known risks for hepatitis C.  Practice safe sex. Use condoms and avoid high-risk sexual practices to reduce the spread of sexually transmitted infections (STIs). STIs include gonorrhea, chlamydia, syphilis, trichomonas, herpes, HPV, and human immunodeficiency virus (HIV). Herpes, HIV, and HPV are viral illnesses that have no cure. They can result in disability, cancer, and death.  A one-time screening for abdominal aortic aneurysm (AAA) and surgical repair of large AAAs by ultrasound are recommended for men ages 79 to 37 years who are current or former smokers.  Healthy men should no longer receive prostate-specific antigen (PSA) blood tests as part of routine  cancer screening. Talk with your health care provider about prostate cancer screening.  Testicular cancer screening is not recommended for adult males who have no symptoms. Screening includes self-exam, a health care provider exam, and other screening tests. Consult with your health care provider about any symptoms you have or any concerns you have about testicular cancer.  Use sunscreen. Apply sunscreen liberally and repeatedly throughout the day. You should seek shade when your shadow is shorter than you. Protect yourself by wearing long sleeves, pants,  a wide-brimmed hat, and sunglasses year round, whenever you are outdoors.  Once a month, do a whole-body skin exam, using a mirror to look at the skin on your back. Tell your health care provider about new moles, moles that have irregular borders, moles that are larger than a pencil eraser, or moles that have changed in shape or color.  Stay current with required vaccines (immunizations).  Influenza vaccine. All adults should be immunized every year.  Tetanus, diphtheria, and acellular pertussis (Td, Tdap) vaccine. An adult who has not previously received Tdap or who does not know his vaccine status should receive 1 dose of Tdap. This initial dose should be followed by tetanus and diphtheria toxoids (Td) booster doses every 10 years. Adults with an unknown or incomplete history of completing a 3-dose immunization series with Td-containing vaccines should begin or complete a primary immunization series including a Tdap dose. Adults should receive a Td booster every 10 years.  Varicella vaccine. An adult without evidence of immunity to varicella should receive 2 doses or a second dose if he has previously received 1 dose.  Human papillomavirus (HPV) vaccine. Males aged 47 21 years who have not received the vaccine previously should receive the 3-dose series. Males aged 50 26 years may be immunized. Immunization is recommended through the age of 78  years for any male who has sex with males and did not get any or all doses earlier. Immunization is recommended for any person with an immunocompromised condition through the age of 24 years if he did not get any or all doses earlier. During the 3-dose series, the second dose should be obtained 4 8 weeks after the first dose. The third dose should be obtained 24 weeks after the first dose and 16 weeks after the second dose.  Zoster vaccine. One dose is recommended for adults aged 76 years or older unless certain conditions are present.  Measles, mumps, and rubella (MMR) vaccine. Adults born before 45 generally are considered immune to measles and mumps. Adults born in [birth year] or later should have 1 or more doses of MMR vaccine unless there is a contraindication to the vaccine or there is laboratory evidence of immunity to each of the three diseases. A routine second dose of MMR vaccine should be obtained at least 28 days after the first dose for students attending postsecondary schools, health care workers, or international travelers. People who received inactivated measles vaccine or an unknown type of measles vaccine during 1963 1967 should receive 2 doses of MMR vaccine. People who received inactivated mumps vaccine or an unknown type of mumps vaccine before 1979 and are at high risk for mumps infection should consider immunization with 2 doses of MMR vaccine. Unvaccinated health care workers born before [birth year] who lack laboratory evidence of measles, mumps, or rubella immunity or laboratory confirmation of disease should consider measles and mumps immunization with 2 doses of MMR vaccine or rubella immunization with 1 dose of MMR vaccine.  Pneumococcal 13-valent conjugate (PCV13) vaccine. When indicated, a person who is uncertain of his immunization history and has no record of immunization should receive the PCV13 vaccine. An adult aged 7 years or older who has certain medical conditions and has not been  previously immunized should receive 1 dose of PCV13 vaccine. This PCV13 should be followed with a dose of pneumococcal polysaccharide (PPSV23) vaccine. The PPSV23 vaccine dose should be obtained at least 8 weeks after the dose of PCV13 vaccine. An adult aged 90 years or older  who has certain medical conditions and previously received 1 or more doses of PPSV23 vaccine should receive 1 dose of PCV13. The PCV13 vaccine dose should be obtained 1 or more years after the last PPSV23 vaccine dose.  Pneumococcal polysaccharide (PPSV23) vaccine. When PCV13 is also indicated, PCV13 should be obtained first. All adults aged 25 years and older should be immunized. An adult younger than age 36 years who has certain medical conditions should be immunized. Any person who resides in a nursing home or long-term care facility should be immunized. An adult smoker should be immunized. People with an immunocompromised condition and certain other conditions should receive both PCV13 and PPSV23 vaccines. People with human immunodeficiency virus (HIV) infection should be immunized as soon as possible after diagnosis. Immunization during chemotherapy or radiation therapy should be avoided. Routine use of PPSV23 vaccine is not recommended for American Indians, [place], or people younger than 65 years unless there are medical conditions that require PPSV23 vaccine. When indicated, people who have unknown immunization and have no record of immunization should receive PPSV23 vaccine. One-time revaccination 5 years after the first dose of PPSV23 is recommended for people aged 7 64 years who have chronic kidney failure, nephrotic syndrome, asplenia, or immunocompromised conditions. People who received 1 2 doses of PPSV23 before age 83 years should receive another dose of PPSV23 vaccine at age 53 years or later if at least 5 years have passed since the previous dose. Doses of PPSV23 are not needed for people immunized with PPSV23 at or  after age 24 years.  Meningococcal vaccine. Adults with asplenia or persistent complement component deficiencies should receive 2 doses of quadrivalent meningococcal conjugate (MenACWY-D) vaccine. The doses should be obtained at least 2 months apart. Microbiologists working with certain meningococcal bacteria, [place] recruits, people at risk during an outbreak, and people who travel to or live in countries with a high rate of meningitis should be immunized. A first-year college student up through age 10 years who is living in a residence hall should receive a dose if he did not receive a dose on or after his 16th birthday. Adults who have certain high-risk conditions should receive one or more doses of vaccine.  Hepatitis A vaccine. Adults who wish to be protected from this disease, have certain high-risk conditions, work with hepatitis A-infected animals, work in hepatitis A research labs, or travel to or work in countries with a high rate of hepatitis A should be immunized. Adults who were previously unvaccinated and who anticipate close contact with an international adoptee during the first 60 days after arrival in the [place] from a country with a high rate of hepatitis A should be immunized.  Hepatitis B vaccine. Adults who wish to be protected from this disease, have certain high-risk conditions, may be exposed to blood or other infectious body fluids, are household contacts or sex partners of hepatitis B positive people, are clients or workers in certain care facilities, or travel to or work in countries with a high rate of hepatitis B should be immunized.  Haemophilus influenzae type b (Hib) vaccine. A previously unvaccinated person with asplenia or sickle cell disease or having a scheduled splenectomy should receive 1 dose of Hib vaccine. Regardless of previous immunization, a recipient of a hematopoietic stem cell transplant should receive a 3-dose series 6 12 months after his successful  transplant. Hib vaccine is not recommended for adults with HIV infection. Preventive Service / Frequency Ages 75 to 21  Blood pressure check.** /  Every 1 to 2 years.  Lipid and cholesterol check.** / Every 5 years beginning at age 65.  Hepatitis C blood test.** / For any individual with known risks for hepatitis C.  Skin self-exam. / Monthly.  Influenza vaccine. / Every year.  Tetanus, diphtheria, and acellular pertussis (Tdap, Td) vaccine.** / Consult your health care provider. 1 dose of Td every 10 years.  Varicella vaccine.** / Consult your health care provider.  HPV vaccine. / 3 doses over 6 months, if 71 or younger.  Measles, mumps, rubella (MMR) vaccine.** / You need at least 1 dose of MMR if you were born in 1957 or later. You may also need a second dose.  Pneumococcal 13-valent conjugate (PCV13) vaccine.** / Consult your health care provider.  Pneumococcal polysaccharide (PPSV23) vaccine.** / 1 to 2 doses if you smoke cigarettes or if you have certain conditions.  Meningococcal vaccine.** / 1 dose if you are age 80 to 41 years and a Market researcher living in a residence hall, or have one of several medical conditions. You may also need additional booster doses.  Hepatitis A vaccine.** / Consult your health care provider.  Hepatitis B vaccine.** / Consult your health care provider.  Haemophilus influenzae type b (Hib) vaccine.** / Consult your health care provider. Ages 58 to 81  Blood pressure check.** / Every 1 to 2 years.  Lipid and cholesterol check.** / Every 5 years beginning at age 50.  Lung cancer screening. / Every year if you are aged 17 80 years and have a 30-pack-year history of smoking and currently smoke or have quit within the past 15 years. Yearly screening is stopped once you have quit smoking for at least 15 years or develop a health problem that would prevent you from having lung cancer treatment.  Fecal occult blood test (FOBT) of stool.  / Every year beginning at age 48 and continuing until age 28. You may not have to do this test if you get a colonoscopy every 10 years.  Flexible sigmoidoscopy** or colonoscopy.** / Every 5 years for a flexible sigmoidoscopy or every 10 years for a colonoscopy beginning at age 44 and continuing until age 9.  Hepatitis C blood test.** / For all people born from [birth year] through 1966 and any individual with known risks for hepatitis C.  Skin self-exam. / Monthly.  Influenza vaccine. / Every year.  Tetanus, diphtheria, and acellular pertussis (Tdap/Td) vaccine.** / Consult your health care provider. 1 dose of Td every 10 years.  Varicella vaccine.** / Consult your health care provider.  Zoster vaccine.** / 1 dose for adults aged 80 years or older.  Measles, mumps, rubella (MMR) vaccine.** / You need at least 1 dose of MMR if you were born in 1958 or later. You may also need a second dose.  Pneumococcal 13-valent conjugate (PCV13) vaccine.** / Consult your health care provider.  Pneumococcal polysaccharide (PPSV23) vaccine.** / 1 to 2 doses if you smoke cigarettes or if you have certain conditions.  Meningococcal vaccine.** / Consult your health care provider.  Hepatitis A vaccine.** / Consult your health care provider.  Hepatitis B vaccine.** / Consult your health care provider.  Haemophilus influenzae type b (Hib) vaccine.** / Consult your health care provider. Ages 75 and over  Blood pressure check.** / Every 1 to 2 years.  Lipid and cholesterol check.**/ Every 5 years beginning at age 38.  Lung cancer screening. / Every year if you are aged 29 80 years and have a 30-pack-year history  of smoking and currently smoke or have quit within the past 15 years. Yearly screening is stopped once you have quit smoking for at least 15 years or develop a health problem that would prevent you from having lung cancer treatment.  Fecal occult blood test (FOBT) of stool. / Every year beginning at age  58 and continuing until age 58. You may not have to do this test if you get a colonoscopy every 10 years.  Flexible sigmoidoscopy** or colonoscopy.** / Every 5 years for a flexible sigmoidoscopy or every 10 years for a colonoscopy beginning at age 84 and continuing until age 33.  Hepatitis C blood test.** / For all people born from [birth year] through 1966 and any individual with known risks for hepatitis C.  Abdominal aortic aneurysm (AAA) screening.** / A one-time screening for ages 55 to 28 years who are current or former smokers.  Skin self-exam. / Monthly.  Influenza vaccine. / Every year.  Tetanus, diphtheria, and acellular pertussis (Tdap/Td) vaccine.** / 1 dose of Td every 10 years.  Varicella vaccine.** / Consult your health care provider.  Zoster vaccine.** / 1 dose for adults aged 29 years or older.  Pneumococcal 13-valent conjugate (PCV13) vaccine.** / Consult your health care provider.  Pneumococcal polysaccharide (PPSV23) vaccine.** / 1 dose for all adults aged 63 years and older.  Meningococcal vaccine.** / Consult your health care provider.  Hepatitis A vaccine.** / Consult your health care provider.  Hepatitis B vaccine.** / Consult your health care provider.  Haemophilus influenzae type b (Hib) vaccine.** / Consult your health care provider. **Family history and personal history of risk and conditions may change your health care provider's recommendations. Document Released: 08/21/2001 Document Revised: 04/15/2013 Document Reviewed: 11/20/2010 [facility] Patient Information 2014 [place], [place].

## 2013-09-08 NOTE — Progress Notes (Signed)
Chief Complaint  Patient presents with  . Annual Exam    HPI: Patient comes in today for Preventive Health Care visit  To gpo back to [place].   Nurse, learning disability.  Live  House shared . Work. Building surveyor .  coughing up phlegm  Lately last week and throat.  Sniffling. No reg tobacco  Weed daily  No new injury had  Td booster 12 13 from head laceration in ed  No ivdu or other rd  One partner in past 6 months  Hx of add treatment  ROS:  GEN/ HEENT: No fever, significant weight changes sweats headaches vision problems hearing changes, CV/ PULM; No chest pain shortness of breath cough, syncope,edema  change in exercise tolerance. GI /GU: No adominal pain, vomiting, change in bowel habits. No blood in the stool. No significant GU symptoms. SKIN/HEME: ,no acute skin rashes suspicious lesions or bleeding. No lymphadenopathy, nodules, masses.  NEURO/ PSYCH:  No neurologic signs such as weakness numbness. No depression anxiety. IMM/ Allergy: No unusual infections.  Allergy .   REST of 12 system review negative except as per HPI   Past Medical History  Diagnosis Date  . Attention deficit disorder with hyperactivity(314.01)     Family History  Problem Relation Age of Onset  . Adopted: Yes    History   Social History  . Marital Status: Single    Spouse Name: N/A    Number of Children: N/A  . Years of Education: N/A   Social History Main Topics  . Smoking status: Current Every Day Smoker  . Smokeless tobacco: None  . Alcohol Use: Yes  . Drug Use: Yes    Special: Marijuana  . Sexual Activity: None   Other Topics Concern  . 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   etoh weekends 7-11 beverages .   recnt worked in [place] with uncle    Outpatient Encounter Prescriptions as of 09/08/2013  Medication Sig  . diphenhydrAMINE (BENADRYL) 25 mg capsule Take 25  mg by mouth every 6 (six) hours as needed.  . [DISCONTINUED] tetrahydrozoline 0.05 % ophthalmic solution Place 1 drop into both eyes 2 (two) times daily.    EXAM:  BP 126/72  Temp(Src) 97.8 F (36.6 C) (Oral)  Ht 6' 0.5" (1.842 m)  Wt 139 lb (63.05 kg)  BMI 18.58 kg/m2  Body mass index is 18.58 kg/(m^2).  Physical Exam: Vital signs reviewed HWT:UUEK is a well-developed well-nourished alert cooperative  male who appears  stated age in no acute distress.  HEENT: normocephalic atraumatic , Eyes: PERRL EOM's full, conjunctiva red , Nares: paten,t no deformity discharge or tenderness., Ears: no deformity EAC's clear TMs with normal landmarks. Mouth: clear OP, no lesions, edema.  Moist mucous membranes. Dentition in adequate repair. NECK: supple without masses, thyromegaly or bruits. CHEST/PULM:  Clear to auscultation and percussion breath sounds equal no wheeze , rales or rhonchi. No chest wall deformities or tenderness. CV: PMI is nondisplaced, S1 S2 no gallops, murmurs, rubs. Peripheral pulses are full without delay.No JVD .  ABDOMEN: Bowel sounds normal nontender  No guard or rebound, no hepato splenomegal no CVA tenderness.   Extremtities:  No clubbing cyanosis or edema, no acute joint swelling or redness no focal atrophy NEURO:  Oriented x3, cranial nerves 3-12 appear to be  intact, no obvious focal weakness,gait within normal limits no abnormal reflexes or asymmetrical SKIN: No acute rashes normal turgor, color, no bruising or petechiae.acne chin area  PSYCH: Oriented, good eye contact, no obvious depression anxiety,  Nl speech and eye contact  no tremor LN: no cervical axillary inguinal adenopathy  Lab Results  Component Value Date   WBC 6.5 09/07/2013   HGB 16.3 09/07/2013   HCT 49.0 09/07/2013   PLT 263.0 09/07/2013   GLUCOSE 84 09/07/2013   CHOL 128 09/07/2013   TRIG 71.0 09/07/2013   HDL 49.60 09/07/2013   LDLCALC 64 09/07/2013   ALT 22 09/07/2013   AST 23 09/07/2013   NA 139 09/07/2013   K  3.6 09/07/2013   CL 105 09/07/2013   CREATININE 1.1 09/07/2013   BUN 16 09/07/2013   CO2 27 09/07/2013   TSH 1.12 09/07/2013    ASSESSMENT AND PLAN:  Discussed the following assessment and plan:  Visit for preventive health examination - disc chornic mari use and etoh risk for health /unhealthy behavior  - Plan: GC/chlamydia probe amp, urine  Routine screening for STI (sexually transmitted infection) - Plan: GC/chlamydia probe amp, urine, RPR, HIV antibody, CANCELED: HIV antibody  Viral upper respiratory tract infection with cough - daily smoker clear today stop   sx rx time fluids  Counseled regarding healthy nutrition, exercise, sleep, injury prevention, calcium vit d and healthy weight . Told patient unhealthy behaviors are a danger to health and success . Denies depressoin use of med   Patient Care Team: Burnis Medin, MD as PCP - General Patient Instructions  Working memory can be permanently  affected by chornic thc exposure  Binge drinking is also unhealthy . Will notify you  of labs when available.  Marijuana Abuse and Chemical Dependency WHEN IS DRUG USE A PROBLEM? Problems related to drug use usually begin with abuse of the substance and lead to dependency.  Abuse is repeated use of a drug with recurrent and significant negative consequences. Abuse happens anytime drug use is interfering with normal living activities including:   Failure to fulfill major obligations at work, school or home (poor work Systems analyst, missing work or school and/or neglecting children and home).  Engaging in activities that are physically dangerous (driving a car or doing recreational activities such as swimming or rock climbing) while under the effects of the drug.  Recurrent drug-related legal problems (arrests for disorderly conduct or assault and battery).  Recurrent social or interpersonal problems caused or increased by the effects of the drug (arguments with family or friends, or physical  fights). Dependency has two parts.   You first develop an emotional/psychological dependence. Psychological dependence develops when your mind tells you that the drug is needed. You come to believe it helps you cope with life.  This is usually followed by physical dependence which has developed when continuing increases of drugs are required to get the same feeling or "high." This may result in:  Withdrawal symptoms such as shakes or tremors.  The substance being over a longer period of time than intended.  An ongoing desire, or unsuccessful effort to, cut down or control the use.  Greater amounts of time spent getting the drug, using the drug or recovering from the effects of the drug.  Important social, work or interests and activities are given up or reduced because or drug use.  Substance is used despite knowledge of ongoing physical (ulcers) or psychological (depression) problems. SIGNS OF CHEMICAL DEPENDENCY:  Friends  or family say there is a problem.  Fighting when using drugs.  Having blackouts (not remembering what you do while using).  Feel sick from using drugs but continue using.  Lie about use or amounts of drugs used.  Need drugs to get you going.  Need drugs to relate to people or feel comfortable in social situations.  Use drugs to forget problems. A "yes" answered to any of the above signs of chemical dependency indicates there are problems. The longer the use of drugs continues, the greater the problems will become. If there is a family history of drug or alcohol use it is best not to experiment with drugs. Experimentation leads to tolerance. Addiction is followed by dependency where drugs are now needed not just to get high but to feel normal. Addiction cannot be cured but it can be stopped. This often requires outside help and the care of professionals. Treatment centers are listed in the yellow pages under: Cocaine, Narcotics, and Alcoholics anonymous. Most  hospitals and clinics can refer you to a specialized care center. WHAT IS MARIJUANA? Marijuana is a plant which grows wild all over the world. The plant contains many chemicals but the active ingredient of the plant is THC (tetrahydrocannabinol). This is responsible for the "high" perceived by people using the drug. HOW IS MARIJUANA USED? Marijuana is smoked, eaten in brownies or any other food, and drank as a tea. WHAT ARE THE EFFECTS OF MARIJUANA? Marijuana is a nervous system depressant which slows the thinking process. Because of this effect, users think marijuana has a calming effect. Actually what happens is the air carrying tubules in the lung become relaxed and allow more oxygen to enter. This causes the user to feel high. The blood pressure falls so less blood reaches the brain and the heart speeds up. As the effects wear off the user becomes depressed. Some people become very paranoid during use. They feel as though people are out to get them. Periodic use can interfere with performance at school or work. Generally Marijuana use does not develop into a physical dependence, but it is very habit forming. Marijuana is also seen as a gateway to use of harder drugs. Strong habits such as using Marijuana, as with all drugs and addictions, can only be helped by stopping use of all chemicals. This is hard but may save your life.  OTHER HEALTH RISKS OF MARIJUANA AND DRUG USE ARE: The increased possibility of getting AIDS or hepatitis (liver inflammation).  HOW TO STAY DRUG FREE ONCE YOU HAVE QUIT USING:  Develop healthy activities and form friends who do not use drugs.  Stay away from the drug scene.  Tell the those who want you to use drugs you have other, better things to do.  Have ready excuses available about why you cannot use.  Attend 12-Step Meetings for support from other recovering people. FOR MORE HELP OR INFORMATION CONTACT YOUR LOCAL CAREGIVER, CLINIC, [place]. Document  Released: 06/22/2000 Document Revised: 10/20/2012 Document Reviewed: 07/23/2007 [facility] Patient Information 2014 [place]. Marijuana Abuse Your exam shows you have used marijuana or pot. There are many health problems related to marijuana abuse. These include:  Bronchitis.  Chronic cough.  Emphysema.  Lung and upper airway cancer. Abusers also experience impairment in:  Memory.  Judgment.  Ability to learn.  Coordination. Students who smoke marijuana:  Get lower grades.  Are less likely to graduate than those who do not. Adults who abuse marijuana:  Have problems at work.  May even lose their jobs due to:  Poor work Systems analyst.  Absenteeism. Attention, memory, and learning skills have been shown to be diminished for up to 6 months after stopping regular use, and there is evidence that the effects can be cumulative over a lifetime.  Heavier use of marijuana also puts a strain on relationships with friends and loved ones and can lead to moodiness and loss of confidence. Acute intoxication can lead to:  Increased anxiety.  A panic episode. It also increases the risk for having an automobile accident. This is especially true if the pot is combined with alcohol or other intoxicants. Treatment for acute intoxication is rarely needed. However, medicine to reduce anxiety may be helpful in some people. Millions of people are considered to be dependent on marijuana. It is long-term regular use that leads to addiction and all of its complex problems. Information on the problem of addiction and the health problems of long-term abuse is posted at the [facility] website, motorcyclefax.com. Consult with your doctor or counselor if you want further information and support in handling this common problem. Document Released: 08/02/2004 Document Revised: 09/17/2011 Document Reviewed: 05/20/2007 [facility] Patient Information 2014 [place], [place].   Preventive  Care for Adults, Male A healthy lifestyle and preventive care can promote health and wellness. Preventive health guidelines for men include the following key practices:  A routine yearly physical is a good way to check with your health care provider about your health and preventative screening. It is a chance to share any concerns and updates on your health and to receive a thorough exam.  Visit your dentist for a routine exam and preventative care every 6 months. Brush your teeth twice a day and floss once a day. Good oral hygiene prevents tooth decay and gum disease.  The frequency of eye exams is based on your age, health, family medical history, use of contact lenses, and other factors. Follow your health care provider's recommendations for frequency of eye exams.  Eat a healthy diet. Foods such as vegetables, fruits, whole grains, low-fat dairy products, and lean protein foods contain the nutrients you need without too many calories. Decrease your intake of foods high in solid fats, added sugars, and salt. Eat the right amount of calories for you.Get information about a proper diet from your health care provider, if necessary.  Regular physical exercise is one of the most important things you can do for your health. Most adults should get at least 150 minutes of moderate-intensity exercise (any activity that increases your heart rate and causes you to sweat) each week. In addition, most adults need muscle-strengthening exercises on 2 or more days a week.  Maintain a healthy weight. The body mass index (BMI) is a screening tool to identify possible weight problems. It provides an estimate of body fat based on height and weight. Your health care provider can find your BMI and can help you achieve or maintain a healthy weight.For adults 20 years and older:  A BMI below 18.5 is considered underweight.  A BMI of 18.5 to 24.9 is normal.  A BMI of 25 to 29.9 is considered overweight.  A BMI of  30 and above is considered obese.  Maintain normal blood lipids and cholesterol levels by exercising and minimizing your intake of saturated fat. Eat a balanced diet with plenty of fruit and vegetables. Blood tests for lipids and cholesterol should begin at age 61 and be repeated every 5 years. If your lipid  or cholesterol levels are high, you are over 50, or you are at high risk for heart disease, you may need your cholesterol levels checked more frequently.Ongoing high lipid and cholesterol levels should be treated with medicines if diet and exercise are not working.  If you smoke, find out from your health care provider how to quit. If you do not use tobacco, do not start.  Lung cancer screening is recommended for adults aged 63 80 years who are at high risk for developing lung cancer because of a history of smoking. A yearly low-dose CT scan of the lungs is recommended for people who have at least a 30-pack-year history of smoking and are a current smoker or have quit within the past 15 years. A pack year of smoking is smoking an average of 1 pack of cigarettes a day for 1 year (for example: 1 pack a day for 30 years or 2 packs a day for 15 years). Yearly screening should continue until the smoker has stopped smoking for at least 15 years. Yearly screening should be stopped for people who develop a health problem that would prevent them from having lung cancer treatment.  If you choose to drink alcohol, do not have more than 2 drinks per day. One drink is considered to be 12 ounces (355 mL) of beer, 5 ounces (148 mL) of wine, or 1.5 ounces (44 mL) of liquor.  Avoid use of street drugs. Do not share needles with anyone. Ask for help if you need support or instructions about stopping the use of drugs.  High blood pressure causes heart disease and increases the risk of stroke. Your blood pressure should be checked at least every 1 2 years. Ongoing high blood pressure should be treated with medicines,  if weight loss and exercise are not effective.  If you are 39 24 years old, ask your health care provider if you should take aspirin to prevent heart disease.  Diabetes screening involves taking a blood sample to check your fasting blood sugar level. This should be done once every 3 years, after age 19, if you are within normal weight and without risk factors for diabetes. Testing should be considered at a younger age or be carried out more frequently if you are overweight and have at least 1 risk factor for diabetes.  Colorectal cancer can be detected and often prevented. Most routine colorectal cancer screening begins at the age of 54 and continues through age 41. However, your health care provider may recommend screening at an earlier age if you have risk factors for colon cancer. On a yearly basis, your health care provider may provide home test kits to check for hidden blood in the stool. Use of a small camera at the end of a tube to directly examine the colon (sigmoidoscopy or colonoscopy) can detect the earliest forms of colorectal cancer. Talk to your health care provider about this at age 59, when routine screening begins. Direct exam of the colon should be repeated every 5 10 years through age 67, unless early forms of precancerous polyps or small growths are found.  People who are at an increased risk for hepatitis B should be screened for this virus. You are considered at high risk for hepatitis B if:  You were born in a country where hepatitis B occurs often. Talk with your health care provider about which countries are considered high-risk.  Your parents were born in a high-risk country and you have not received a shot to  protect against hepatitis B (hepatitis B vaccine).  You have HIV or AIDS.  You use needles to inject street drugs.  You live with, or have sex with, someone who has hepatitis B.  You are a man who has sex with other men (MSM).  You get hemodialysis  treatment.  You take certain medicines for conditions such as cancer, organ transplantation, and autoimmune conditions.  Hepatitis C blood testing is recommended for all people born from [birth year] through 1966 and any individual with known risks for hepatitis C.  Practice safe sex. Use condoms and avoid high-risk sexual practices to reduce the spread of sexually transmitted infections (STIs). STIs include gonorrhea, chlamydia, syphilis, trichomonas, herpes, HPV, and human immunodeficiency virus (HIV). Herpes, HIV, and HPV are viral illnesses that have no cure. They can result in disability, cancer, and death.  A one-time screening for abdominal aortic aneurysm (AAA) and surgical repair of large AAAs by ultrasound are recommended for men ages 19 to 55 years who are current or former smokers.  Healthy men should no longer receive prostate-specific antigen (PSA) blood tests as part of routine cancer screening. Talk with your health care provider about prostate cancer screening.  Testicular cancer screening is not recommended for adult males who have no symptoms. Screening includes self-exam, a health care provider exam, and other screening tests. Consult with your health care provider about any symptoms you have or any concerns you have about testicular cancer.  Use sunscreen. Apply sunscreen liberally and repeatedly throughout the day. You should seek shade when your shadow is shorter than you. Protect yourself by wearing long sleeves, pants, a wide-brimmed hat, and sunglasses year round, whenever you are outdoors.  Once a month, do a whole-body skin exam, using a mirror to look at the skin on your back. Tell your health care provider about new moles, moles that have irregular borders, moles that are larger than a pencil eraser, or moles that have changed in shape or color.  Stay current with required vaccines (immunizations).  Influenza vaccine. All adults should be immunized every year.  Tetanus,  diphtheria, and acellular pertussis (Td, Tdap) vaccine. An adult who has not previously received Tdap or who does not know his vaccine status should receive 1 dose of Tdap. This initial dose should be followed by tetanus and diphtheria toxoids (Td) booster doses every 10 years. Adults with an unknown or incomplete history of completing a 3-dose immunization series with Td-containing vaccines should begin or complete a primary immunization series including a Tdap dose. Adults should receive a Td booster every 10 years.  Varicella vaccine. An adult without evidence of immunity to varicella should receive 2 doses or a second dose if he has previously received 1 dose.  Human papillomavirus (HPV) vaccine. Males aged 73 21 years who have not received the vaccine previously should receive the 3-dose series. Males aged 66 26 years may be immunized. Immunization is recommended through the age of 56 years for any male who has sex with males and did not get any or all doses earlier. Immunization is recommended for any person with an immunocompromised condition through the age of 49 years if he did not get any or all doses earlier. During the 3-dose series, the second dose should be obtained 4 8 weeks after the first dose. The third dose should be obtained 24 weeks after the first dose and 16 weeks after the second dose.  Zoster vaccine. One dose is recommended for adults aged 73 years or older unless  certain conditions are present.  Measles, mumps, and rubella (MMR) vaccine. Adults born before [birth year] generally are considered immune to measles and mumps. Adults born in [birth year] or later should have 1 or more doses of MMR vaccine unless there is a contraindication to the vaccine or there is laboratory evidence of immunity to each of the three diseases. A routine second dose of MMR vaccine should be obtained at least 28 days after the first dose for students attending postsecondary schools, health care workers, or international  travelers. People who received inactivated measles vaccine or an unknown type of measles vaccine during 1963 1967 should receive 2 doses of MMR vaccine. People who received inactivated mumps vaccine or an unknown type of mumps vaccine before 1979 and are at high risk for mumps infection should consider immunization with 2 doses of MMR vaccine. Unvaccinated health care workers born before [birth year] who lack laboratory evidence of measles, mumps, or rubella immunity or laboratory confirmation of disease should consider measles and mumps immunization with 2 doses of MMR vaccine or rubella immunization with 1 dose of MMR vaccine.  Pneumococcal 13-valent conjugate (PCV13) vaccine. When indicated, a person who is uncertain of his immunization history and has no record of immunization should receive the PCV13 vaccine. An adult aged 68 years or older who has certain medical conditions and has not been previously immunized should receive 1 dose of PCV13 vaccine. This PCV13 should be followed with a dose of pneumococcal polysaccharide (PPSV23) vaccine. The PPSV23 vaccine dose should be obtained at least 8 weeks after the dose of PCV13 vaccine. An adult aged 74 years or older who has certain medical conditions and previously received 1 or more doses of PPSV23 vaccine should receive 1 dose of PCV13. The PCV13 vaccine dose should be obtained 1 or more years after the last PPSV23 vaccine dose.  Pneumococcal polysaccharide (PPSV23) vaccine. When PCV13 is also indicated, PCV13 should be obtained first. All adults aged 13 years and older should be immunized. An adult younger than age 28 years who has certain medical conditions should be immunized. Any person who resides in a nursing home or long-term care facility should be immunized. An adult smoker should be immunized. People with an immunocompromised condition and certain other conditions should receive both PCV13 and PPSV23 vaccines. People with human immunodeficiency virus (HIV)  infection should be immunized as soon as possible after diagnosis. Immunization during chemotherapy or radiation therapy should be avoided. Routine use of PPSV23 vaccine is not recommended for American Indians, [place] Natives, or people younger than 65 years unless there are medical conditions that require PPSV23 vaccine. When indicated, people who have unknown immunization and have no record of immunization should receive PPSV23 vaccine. One-time revaccination 5 years after the first dose of PPSV23 is recommended for people aged 28 64 years who have chronic kidney failure, nephrotic syndrome, asplenia, or immunocompromised conditions. People who received 1 2 doses of PPSV23 before age 48 years should receive another dose of PPSV23 vaccine at age 47 years or later if at least 5 years have passed since the previous dose. Doses of PPSV23 are not needed for people immunized with PPSV23 at or after age 74 years.  Meningococcal vaccine. Adults with asplenia or persistent complement component deficiencies should receive 2 doses of quadrivalent meningococcal conjugate (MenACWY-D) vaccine. The doses should be obtained at least 2 months apart. Microbiologists working with certain meningococcal bacteria, [employer] recruits, people at risk during an outbreak, and people who travel to or live in countries with  a high rate of meningitis should be immunized. A first-year college student up through age 51 years who is living in a residence hall should receive a dose if he did not receive a dose on or after his 16th birthday. Adults who have certain high-risk conditions should receive one or more doses of vaccine.  Hepatitis A vaccine. Adults who wish to be protected from this disease, have certain high-risk conditions, work with hepatitis A-infected animals, work in hepatitis A research labs, or travel to or work in countries with a high rate of hepatitis A should be immunized. Adults who were previously unvaccinated and who  anticipate close contact with an international adoptee during the first 60 days after arrival in the [place] from a country with a high rate of hepatitis A should be immunized.  Hepatitis B vaccine. Adults who wish to be protected from this disease, have certain high-risk conditions, may be exposed to blood or other infectious body fluids, are household contacts or sex partners of hepatitis B positive people, are clients or workers in certain care facilities, or travel to or work in countries with a high rate of hepatitis B should be immunized.  Haemophilus influenzae type b (Hib) vaccine. A previously unvaccinated person with asplenia or sickle cell disease or having a scheduled splenectomy should receive 1 dose of Hib vaccine. Regardless of previous immunization, a recipient of a hematopoietic stem cell transplant should receive a 3-dose series 6 12 months after his successful transplant. Hib vaccine is not recommended for adults with HIV infection. Preventive Service / Frequency Ages 32 to 59  Blood pressure check.** / Every 1 to 2 years.  Lipid and cholesterol check.** / Every 5 years beginning at age 70.  Hepatitis C blood test.** / For any individual with known risks for hepatitis C.  Skin self-exam. / Monthly.  Influenza vaccine. / Every year.  Tetanus, diphtheria, and acellular pertussis (Tdap, Td) vaccine.** / Consult your health care provider. 1 dose of Td every 10 years.  Varicella vaccine.** / Consult your health care provider.  HPV vaccine. / 3 doses over 6 months, if 75 or younger.  Measles, mumps, rubella (MMR) vaccine.** / You need at least 1 dose of MMR if you were born in 1957 or later. You may also need a second dose.  Pneumococcal 13-valent conjugate (PCV13) vaccine.** / Consult your health care provider.  Pneumococcal polysaccharide (PPSV23) vaccine.** / 1 to 2 doses if you smoke cigarettes or if you have certain conditions.  Meningococcal vaccine.** / 1 dose  if you are age 55 to 85 years and a Market researcher living in a residence hall, or have one of several medical conditions. You may also need additional booster doses.  Hepatitis A vaccine.** / Consult your health care provider.  Hepatitis B vaccine.** / Consult your health care provider.  Haemophilus influenzae type b (Hib) vaccine.** / Consult your health care provider. Ages 87 to 44  Blood pressure check.** / Every 1 to 2 years.  Lipid and cholesterol check.** / Every 5 years beginning at age 39.  Lung cancer screening. / Every year if you are aged 37 80 years and have a 30-pack-year history of smoking and currently smoke or have quit within the past 15 years. Yearly screening is stopped once you have quit smoking for at least 15 years or develop a health problem that would prevent you from having lung cancer treatment.  Fecal occult blood test (FOBT) of stool. / Every year beginning at  age 76 and continuing until age 33. You may not have to do this test if you get a colonoscopy every 10 years.  Flexible sigmoidoscopy** or colonoscopy.** / Every 5 years for a flexible sigmoidoscopy or every 10 years for a colonoscopy beginning at age 55 and continuing until age 74.  Hepatitis C blood test.** / For all people born from [birth year] through 1966 and any individual with known risks for hepatitis C.  Skin self-exam. / Monthly.  Influenza vaccine. / Every year.  Tetanus, diphtheria, and acellular pertussis (Tdap/Td) vaccine.** / Consult your health care provider. 1 dose of Td every 10 years.  Varicella vaccine.** / Consult your health care provider.  Zoster vaccine.** / 1 dose for adults aged 24 years or older.  Measles, mumps, rubella (MMR) vaccine.** / You need at least 1 dose of MMR if you were born in 1957 or later. You may also need a second dose.  Pneumococcal 13-valent conjugate (PCV13) vaccine.** / Consult your health care provider.  Pneumococcal polysaccharide (PPSV23)  vaccine.** / 1 to 2 doses if you smoke cigarettes or if you have certain conditions.  Meningococcal vaccine.** / Consult your health care provider.  Hepatitis A vaccine.** / Consult your health care provider.  Hepatitis B vaccine.** / Consult your health care provider.  Haemophilus influenzae type b (Hib) vaccine.** / Consult your health care provider. Ages 57 and over  Blood pressure check.** / Every 1 to 2 years.  Lipid and cholesterol check.**/ Every 5 years beginning at age 43.  Lung cancer screening. / Every year if you are aged 56 80 years and have a 30-pack-year history of smoking and currently smoke or have quit within the past 15 years. Yearly screening is stopped once you have quit smoking for at least 15 years or develop a health problem that would prevent you from having lung cancer treatment.  Fecal occult blood test (FOBT) of stool. / Every year beginning at age 70 and continuing until age 56. You may not have to do this test if you get a colonoscopy every 10 years.  Flexible sigmoidoscopy** or colonoscopy.** / Every 5 years for a flexible sigmoidoscopy or every 10 years for a colonoscopy beginning at age 21 and continuing until age 12.  Hepatitis C blood test.** / For all people born from [birth year] through 1966 and any individual with known risks for hepatitis C.  Abdominal aortic aneurysm (AAA) screening.** / A one-time screening for ages 74 to 52 years who are current or former smokers.  Skin self-exam. / Monthly.  Influenza vaccine. / Every year.  Tetanus, diphtheria, and acellular pertussis (Tdap/Td) vaccine.** / 1 dose of Td every 10 years.  Varicella vaccine.** / Consult your health care provider.  Zoster vaccine.** / 1 dose for adults aged 72 years or older.  Pneumococcal 13-valent conjugate (PCV13) vaccine.** / Consult your health care provider.  Pneumococcal polysaccharide (PPSV23) vaccine.** / 1 dose for all adults aged 67 years and older.  Meningococcal  vaccine.** / Consult your health care provider.  Hepatitis A vaccine.** / Consult your health care provider.  Hepatitis B vaccine.** / Consult your health care provider.  Haemophilus influenzae type b (Hib) vaccine.** / Consult your health care provider. **Family history and personal history of risk and conditions may change your health care provider's recommendations. Document Released: 08/21/2001 Document Revised: 04/15/2013 Document Reviewed: 11/20/2010 [facility] Patient Information 2014 [place], [place].      Standley Brooking. Panosh M.D.   Pre visit review using our clinic review tool,  if applicable. No additional management support is needed unless otherwise documented below in the visit note.

## 2013-09-09 ENCOUNTER — Encounter: Payer: BC Managed Care – PPO | Admitting: Internal Medicine

## 2013-09-09 LAB — GC/CHLAMYDIA PROBE AMP, URINE
Chlamydia, Swab/Urine, PCR: NEGATIVE
GC Probe Amp, Urine: NEGATIVE

## 2014-05-11 ENCOUNTER — Other Ambulatory Visit: Payer: Self-pay | Admitting: Family Medicine

## 2014-05-11 DIAGNOSIS — F909 Attention-deficit hyperactivity disorder, unspecified type: Secondary | ICD-10-CM

## 2014-05-12 ENCOUNTER — Ambulatory Visit (INDEPENDENT_AMBULATORY_CARE_PROVIDER_SITE_OTHER): Payer: BC Managed Care – PPO | Admitting: Internal Medicine

## 2014-05-12 ENCOUNTER — Encounter: Payer: Self-pay | Admitting: Internal Medicine

## 2014-05-12 VITALS — BP 122/60 | Temp 97.9°F | Ht 73.0 in | Wt 142.0 lb

## 2014-05-12 DIAGNOSIS — J069 Acute upper respiratory infection, unspecified: Secondary | ICD-10-CM

## 2014-05-12 DIAGNOSIS — B9789 Other viral agents as the cause of diseases classified elsewhere: Principal | ICD-10-CM

## 2014-05-12 DIAGNOSIS — Z8659 Personal history of other mental and behavioral disorders: Secondary | ICD-10-CM

## 2014-05-12 DIAGNOSIS — Z720 Tobacco use: Secondary | ICD-10-CM

## 2014-05-12 DIAGNOSIS — F17200 Nicotine dependence, unspecified, uncomplicated: Secondary | ICD-10-CM

## 2014-05-12 MED ORDER — DOXYCYCLINE HYCLATE 100 MG PO TABS: 100.0000 mg | ORAL_TABLET | Freq: Two times a day (BID) | ORAL | Status: DC

## 2014-05-12 NOTE — Patient Instructions (Addendum)
Chest is clear .  And inhalants is  Keeping the cough infection going . If nasal drainage doesn't improve in the next week can add  for possible sinus infection.   Stopping chemicals that effect motivation and memory can  Only help the add sx . Then possible adding medication. Suggest getting eval through de [facility] and  The ROV  ( may then see prescribing specialist.  To decide on next step)

## 2014-05-12 NOTE — Progress Notes (Signed)
Pre visit review using our clinic review tool, if applicable. No additional management support is needed unless otherwise documented below in the visit note.  Chief Complaint  Patient presents with  . Nasal Congestion    Ongoing for a few weeks.  . Cough    HPI: Patient Glen Wilson  comes in today for SDA for  new problem evaluation. Also  Might be interested in going back on add medsication.   Mom thinks would help motivation and going out to get a job.  We prescribed these meds when he was younger but didn't like to take them and ? tif they helped .  Currently referred for eval to Dr Roma Schanz[place]tebec.  Still tobacco mj and etoh  Willing to stop  Now cough uro sx for about 2 weeks  No sob no hemoptysis  yellow phlegm thinks from tobacco hard to shake  ROS: See pertinent positives and negatives per HPI. No cp sob   Past Medical History  Diagnosis Date  . 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 Allergy     Family History  Problem Relation Age of Onset  . Adopted: Yes    History   Social History  . Marital Status: Single    Spouse Name: N/A    Number of Children: N/A  . 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 Smoking status: Current Every Day Smoker  . Smokeless tobacco: Never Used  . Alcohol Use: 0.0 oz/week    0 Not specified per week  . Drug Use: Yes    Special: Marijuana  . Sexual Activity: None   Other Topics Concern  . None   Social History Narrative   Lives at home with his parents.     [employer]    Smokes marijuana everyday   Drinks alcohol everyday.         Outpatient Encounter Prescriptions as of 05/12/2014  Medication Sig  . diphenhydrAMINE (BENADRYL) 25 mg capsule Take 25 mg by mouth every 6 (six) hours as needed.  . doxycycline (VIBRA-TABS) 100 MG tablet Take 1 tablet (100 mg total) by mouth 2 (two) times daily.    EXAM:  BP 122/60 mmHg   Temp(Src) 97.9 F (36.6 C) (Oral)  Ht 6\' 1"  (1.854 m)  Wt 142 lb (64.411 kg)  BMI 18.74 kg/m2  Body mass index is 18.74 kg/(m^2).  GENERAL: vitals reviewed and listed above, alert, oriented, appears well hydrated and in no acute distress congested in nad good eye contact  HEENT: atraumatic, conjunctiva  clear, no obvious abnormalities on inspection of external nose and earstnx nl very stuffy face non tender  Left nostri yellow opaque drainage  OP : no lesion edema or exudate  NECK: no obvious masses on inspection palpation  LUNGS: clear to auscultation bilaterally, no wheezes, rales or rhonchi,  CV: HRRR, no clubbing cyanosis  nl cap refill  MS: moves all extremities without noticeable focal  abnormality PSYCH: pleasant and cooperative,  Good eye contact  Speech and thought  no obvious depression or anxiety   ASSESSMENT AND PLAN:  Discussed the following assessment and plan:  Viral URI with cough  Protracted URI  Tobacco use disorder  History of ADHD Hx add but using MJ etoh and tobacco  2 effecting mental clarity and focusing disc about avoiding chemicals that effect wWM and motivation and sleep etc . Strategies .  Proceed with reeval dr A ot willing to see psychiatry also to help.  -Patient advised to return or notify health care team  if symptoms worsen ,persist or new concerns arise.  Patient Instructions  Chest is clear .  And inhalants is  Keeping the cough infection going . If nasal drainage doesn't improve in the next week can add  for possible sinus infection.   Stopping chemicals that effect motivation and memory can  Only help the add sx . Then possible adding medication. Suggest getting eval through de [facility] and  The [facility]OV  ( may then see prescribing specialist.  To decide on next step)     Neta MendsWanda K. Radiah Lubinski M.D. Total visit 25mins > 50% spent counseling and coordinating care

## 2014-05-13 ENCOUNTER — Telehealth: Payer: Self-pay | Admitting: Internal Medicine

## 2014-05-13 DIAGNOSIS — F17200 Nicotine dependence, unspecified, uncomplicated: Secondary | ICD-10-CM | POA: Insufficient documentation

## 2014-05-13 NOTE — Telephone Encounter (Signed)
emmi mailed  °

## 2014-07-07 ENCOUNTER — Telehealth: Payer: Self-pay | Admitting: Internal Medicine

## 2014-07-07 NOTE — Telephone Encounter (Signed)
No DPR on file.  Cannot speak to the mother

## 2014-07-07 NOTE — Telephone Encounter (Signed)
Dr. Fabian SharpPanosh does not have a recommendation.  She can call behavioral health for that information.  Please provide the pt mother with number if she would like like.

## 2014-07-07 NOTE — Telephone Encounter (Signed)
Pt mom would like dr Fabian Sharppanosh nurse to return her call. Pt mom has some general questions she did not elaborate

## 2014-07-07 NOTE — Telephone Encounter (Addendum)
Pt mom  Would like a name of  inpatient rehab facility.

## 2014-07-08 NOTE — Telephone Encounter (Signed)
Pt mom is aware 

## 2014-07-21 ENCOUNTER — Ambulatory Visit (INDEPENDENT_AMBULATORY_CARE_PROVIDER_SITE_OTHER): Payer: BLUE CROSS/BLUE SHIELD | Admitting: Psychology

## 2014-07-21 DIAGNOSIS — F902 Attention-deficit hyperactivity disorder, combined type: Secondary | ICD-10-CM

## 2014-07-21 DIAGNOSIS — F330 Major depressive disorder, recurrent, mild: Secondary | ICD-10-CM

## 2014-08-11 ENCOUNTER — Ambulatory Visit (INDEPENDENT_AMBULATORY_CARE_PROVIDER_SITE_OTHER): Payer: BLUE CROSS/BLUE SHIELD | Admitting: Psychology

## 2014-08-11 DIAGNOSIS — F900 Attention-deficit hyperactivity disorder, predominantly inattentive type: Secondary | ICD-10-CM

## 2014-08-25 ENCOUNTER — Ambulatory Visit (INDEPENDENT_AMBULATORY_CARE_PROVIDER_SITE_OTHER): Payer: BLUE CROSS/BLUE SHIELD | Admitting: Psychology

## 2014-08-25 DIAGNOSIS — F900 Attention-deficit hyperactivity disorder, predominantly inattentive type: Secondary | ICD-10-CM

## 2014-08-25 DIAGNOSIS — F331 Major depressive disorder, recurrent, moderate: Secondary | ICD-10-CM

## 2015-08-30 ENCOUNTER — Telehealth: Payer: Self-pay | Admitting: Internal Medicine

## 2015-08-30 NOTE — Telephone Encounter (Signed)
Will check with WP.  Do see where she has prescribed any ADD meds.

## 2015-08-30 NOTE — Telephone Encounter (Signed)
I had referred  Or given him dr Sandrea Hughs name  Advise reevaluation      About adhd  Profile  Before considering  rx ing   Any meds  May we refer  For add evaluation?   And then    rov after report back

## 2015-08-30 NOTE — Telephone Encounter (Signed)
Dad called for pt to get appointment to see Dr Fabian Sharp to talk about putting pt back on ADD meds. However, pt needs a Monday, Tues, or Wed appt, in the afternoon. Only same day 30 min are available. Is it OK to work in?

## 2015-08-31 NOTE — Telephone Encounter (Signed)
Pt dad is calling and is aware misty will call his son

## 2015-08-31 NOTE — Telephone Encounter (Signed)
Tried reaching [place].  Received his father.  Advised no DPR on file.  Will need to speak to Leonard.  Father will have pt call back or come sign DPR.

## 2015-09-05 ENCOUNTER — Encounter: Payer: Self-pay | Admitting: Family Medicine

## 2015-09-05 NOTE — Telephone Encounter (Signed)
Have been waiting on DPR.  Will send a letter.

## 2015-09-21 ENCOUNTER — Ambulatory Visit (HOSPITAL_COMMUNITY): Payer: BLUE CROSS/BLUE SHIELD | Admitting: Psychology

## 2015-09-21 DIAGNOSIS — F1220 Cannabis dependence, uncomplicated: Secondary | ICD-10-CM

## 2015-09-21 DIAGNOSIS — F1020 Alcohol dependence, uncomplicated: Secondary | ICD-10-CM

## 2015-09-22 ENCOUNTER — Other Ambulatory Visit (HOSPITAL_COMMUNITY): Payer: Self-pay | Admitting: Medical

## 2015-09-22 ENCOUNTER — Other Ambulatory Visit (HOSPITAL_COMMUNITY): Payer: BLUE CROSS/BLUE SHIELD | Attending: Medical | Admitting: Psychology

## 2015-09-22 DIAGNOSIS — F1020 Alcohol dependence, uncomplicated: Secondary | ICD-10-CM

## 2015-09-22 DIAGNOSIS — F901 Attention-deficit hyperactivity disorder, predominantly hyperactive type: Secondary | ICD-10-CM | POA: Insufficient documentation

## 2015-09-22 DIAGNOSIS — F17200 Nicotine dependence, unspecified, uncomplicated: Secondary | ICD-10-CM | POA: Insufficient documentation

## 2015-09-22 DIAGNOSIS — F1220 Cannabis dependence, uncomplicated: Secondary | ICD-10-CM

## 2015-09-22 DIAGNOSIS — F1410 Cocaine abuse, uncomplicated: Secondary | ICD-10-CM | POA: Insufficient documentation

## 2015-09-24 ENCOUNTER — Encounter (HOSPITAL_COMMUNITY): Payer: Self-pay | Admitting: Psychology

## 2015-09-24 DIAGNOSIS — F1020 Alcohol dependence, uncomplicated: Secondary | ICD-10-CM | POA: Insufficient documentation

## 2015-09-24 DIAGNOSIS — F1220 Cannabis dependence, uncomplicated: Secondary | ICD-10-CM | POA: Insufficient documentation

## 2015-09-24 NOTE — Progress Notes (Signed)
Glen Wilson is a 25 y.o. male patient. Orientation to CD-IOP: The patient is a 25 yo single, white, male seeking entry into the CD-IOP to address his alcohol and drug use.  The patient lives with his parents in [place], [place]. 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 He has worked there for 4 years. The patient began using drugs and alcohol in his early teens. He has never had any legal problems, but his relationship with his parents has been very strained by his drinking, drugging and disrespectful behaviors. The patient and his mother met with me approximately one year ago, but he was very resistant and rude and eventually walked out of the orientation. Today, he reported that he has a girlfriend and he wants to do better and make changes in his life. The patient reported his heaviest drinking was in the past 2 years when he was drinking a fifth of tequila or whiskey per day. He reported he has scaled back on his drinking over the past month and has reduced his drinking from 7 days to 4 days. He drinks 1-2 beers and a shot or 2 after work. His last alcohol use was 2 days ago.  The patient reported he smokes cannabis daily and last used yesterday. He smokes about $10 per day. The patent reported he uses cocaine 2-3 times a month, but rarely buys it and uses when friends offer him some. He uses a 'couple of bumps". The patient reported he has taken ecstasy, mushrooms and LSD around 20 times, but denied any use for almost 9 months. The patient is the youngest child in the family and was adopted as an infant. There is no information about his biological family. His older brother is 81 yo and his sister is 88 yo. The patient had problems with attention from a very early age and was reportedly prescribed Adderall when he was only 25 yo. The patient behavior was problematic in middle school and his parents sent him to [employer] [facility].  His drug use increased during that time and  after 2 years, he returned to the public school system and graduated from [employer]. He completed one year of classes at [employer], but dropped out and has worked at [employer] since then. He admitted he couldn't pass a drug test so he hasn't tried to get a better job. When questioned about depression, the patient denied a mood disorder, but admitted that he lacks motivation and drive. The patient was pleasant and seemingly honest about his drug use history and reported he wants to change. He is willing to attend AA meetings and noted that back in 2014, he attended some meetings in [place] at [facility] [employer] and stayed sober for a few months before returning to use. His parents will transport him to treatment because the transmission on his car recently went out and he has not vehicle. The documentation was reviewed, signed and the orientation completed accordingly. The patient will return and begin the CD-IOP group on Thursday, March 16th.         Traci Plemons, LCAS

## 2015-09-26 ENCOUNTER — Encounter (HOSPITAL_COMMUNITY): Payer: Self-pay | Admitting: Medical

## 2015-09-26 ENCOUNTER — Encounter (HOSPITAL_COMMUNITY): Payer: Self-pay | Admitting: Psychology

## 2015-09-26 ENCOUNTER — Other Ambulatory Visit (HOSPITAL_COMMUNITY): Payer: BLUE CROSS/BLUE SHIELD | Admitting: Psychology

## 2015-09-26 DIAGNOSIS — F1994 Other psychoactive substance use, unspecified with psychoactive substance-induced mood disorder: Secondary | ICD-10-CM

## 2015-09-26 DIAGNOSIS — F1020 Alcohol dependence, uncomplicated: Secondary | ICD-10-CM | POA: Diagnosis present

## 2015-09-26 DIAGNOSIS — F1220 Cannabis dependence, uncomplicated: Secondary | ICD-10-CM

## 2015-09-26 DIAGNOSIS — F17200 Nicotine dependence, unspecified, uncomplicated: Secondary | ICD-10-CM | POA: Diagnosis not present

## 2015-09-26 DIAGNOSIS — F14120 Cocaine abuse with intoxication, uncomplicated: Secondary | ICD-10-CM | POA: Insufficient documentation

## 2015-09-26 DIAGNOSIS — Z8659 Personal history of other mental and behavioral disorders: Secondary | ICD-10-CM

## 2015-09-26 DIAGNOSIS — F1410 Cocaine abuse, uncomplicated: Secondary | ICD-10-CM | POA: Diagnosis not present

## 2015-09-26 DIAGNOSIS — F901 Attention-deficit hyperactivity disorder, predominantly hyperactive type: Secondary | ICD-10-CM | POA: Diagnosis not present

## 2015-09-26 HISTORY — DX: Other psychoactive substance use, unspecified with psychoactive substance-induced mood disorder: F19.94

## 2015-09-26 NOTE — Progress Notes (Signed)
    Daily Group Progress Note  Program: CD-IOP   Group Time: 1-2:30 pm  Participation Level: Active  Behavioral Response: Appropriate and Sharing  Type of Therapy: Process Group  Topic: Process: the first half of group was spent in process. Members shared about current issues and concerns in early recovery. They were invited to discuss any thoughts or cravings and how they addressed them. A new group member was present today and he introduced himself in this part of group. A drug test was collected from this newest member.   Group Time: 2:45- 4pm  Participation Level: Minimal  Behavioral Response: Appropriate  Type of Therapy: Psycho-education Group  Topic: Psycho-Ed: The Progression of Addiction/Graduation: the second part of group was spent in a psycho-ed. The topic was the progression of the disease of Addiction and included a handout with the famous [place] Chart. Members were provided with a blank sheet of paper and asked to chart the progression of their own addiction. After about 10 minutes of drawing, members were asked to show their chart and explain the 'highlights' of the use. As the session neared the end, two guests entered the group room to be present for the graduation ceremony. The graduate's wife and sponsor had come to be here as he successfully graduated from the program. The ceremony was held with kind words shared between members and the graduating one.   Summary: The patient was new to the group today. He shared about himself and his history of alcohol and drug use. He described being a heavy drinker, but noted he has cut back considerably in the past 6 months. He admitted he smokes cannabis daily. The patient explained he wanted to do more with his life. He reported his car's transmission had failed and he had to depend on his parents to transport him. The patient also reported he had come here a year ago, but admitted he had not been ready and got mad and walked  out. Today he is here because he wants to be. The patient reported he had not planned on using yesterday, but reported, my girlfriend came home and had had a bad day at work so he "got drunk, smoked weed and got a few bumps of coke" from friends. The fact that he got drunk because she had a bad day was missed on this young man, but not by any of the other group members. He admitted he had used after midnight so his sobriety date was tomorrow. The patient was attentive and did better than anticipated in this first session. He wished the graduating member 'good luck' and he responded well to this intervention. The patient's sobriety date is tomorrow, 3/17.   Family Program: Family present? No   Name of family member(s):   UDS collected: Yes Results: pending  AA/NA attended?: No, but he is new to the group  Sponsor?: No   Yumalay Circle, LCAS

## 2015-09-26 NOTE — Progress Notes (Signed)
Psychiatric Initial Adult Assessment   Patient Identification: Glen Wilson MRN:  [id number] Date of Evaluation:  09/26/2015 Referral Source: Parents Chief Complaint:  "I like what I have right now -need to get away from smoking and drinking . I know I need help and want to around people going down the same road ." Chief Complaint    Alcohol Problem; Drug Problem     Visit Diagnosis:    ICD-9-CM ICD-10-CM   1. Alcohol use disorder, moderate, dependence (HCC) 303.90 F10.20   2. Cannabis use disorder, severe, dependence (HCC) 304.30 F12.20   3. Cocaine abuse with intoxication and without complication ([place]) 858.85 F14.120   4. Tobacco use disorder 305.1 F17.200   5. History of ADHD V11.8 Z86.59   6. Substance induced mood disorder (HCC) 292.84 F19.94    Diagnosis:   Patient Active Problem List   Diagnosis Date Noted  . Cocaine abuse with intoxication and without complication ([place]) [O27.741] 09/26/2015  . Substance induced mood disorder ([place]) [F19.94] 09/26/2015  . Alcohol use disorder, moderate, dependence ([place]) [F10.20] 09/24/2015  . Cannabis use disorder, severe, dependence ([place]) [F12.20] 09/24/2015  . Tobacco use disorder [F17.200] 05/13/2014  . History of ADHD [Z86.59] 05/12/2014  . ACNE [L70.8] 03/31/2008  . ADHD [F90.9] 02/19/2007  . ALLERGIC RHINITIS [J30.9] 02/19/2007   History of Present Illness: 25 yo WM presented for treatment for his alcoholism and cannabis dependence and was assessed by Brandon Melnick LCAS as follows: Glen Wilson is a 25 y.o. male patient. Orientation to CD-IOP: The patient is a 25 yo single, white, male seeking entry into the CD-IOP to address his alcohol and drug use.  The patient lives with his parents in [place], [place]. 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 He has worked there for 4 years. The patient began using drugs and alcohol in his early teens. He has never had any legal problems, but his relationship with his parents  has been very strained by his drinking, drugging and disrespectful behaviors. The patient and his mother met with me approximately one year ago, but he was very resistant and rude and eventually walked out of the orientation. Today, he reported that he has a girlfriend and he wants to do better and make changes in his life. The patient reported his heaviest drinking was in the past 2 years when he was drinking a fifth of tequila or whiskey per day. He reported he has scaled back on his drinking over the past month and has reduced his drinking from 7 days to 4 days. He drinks 1-2 beers and a shot or 2 after work. His last alcohol use was 2 days ago.  The patient reported he smokes cannabis daily and last used yesterday. He smokes about $10 per day. The patent reported he uses cocaine 2-3 times a month, but rarely buys it and uses when friends offer him some. He uses a 'couple of bumps". The patient reported he has taken ecstasy, mushrooms and LSD around 20 times, but denied any use for almost 9 months. The patient is the youngest child in the family and was adopted as an infant. There is no information about his biological family. His older brother is 44 yo and his sister is 22 yo. The patient had problems with attention from a very early age and was reportedly prescribed Adderall when he was only 25 yo. 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  His drug use increased during that time and after 2 years, he returned to the public school system and graduated from [employer]. He completed one year of classes at [employer], but dropped out and has worked at [employer] since then. He admitted he couldn't pass a drug test so he hasn't tried to get a better job. When questioned about depression, the patient denied a mood disorder, but admitted that he lacks motivation and drive. The patient was pleasant and seemingly honest about his drug use history and  reported he wants to change. He is willing to attend AA meetings and noted that back in 2014, he attended some meetings in [place] at [employer] and stayed sober for a few months before returning to use. His parents will transport him to treatment because the transmission on his car recently went out and he has not vehicle. The documentation was reviewed, signed and the orientation completed accordingly. The patient will return and begin the CD-IOP group on Thursday, March 16th.   In speaking with him today he admits that he drank over the weekend due to trauma of finding one of their kittens dead in [place] after being gone.His girlfriend was almost inconsolable as well which he says contributed to his return to use. He is quite open about his increasing use of alcohol up to a 1/5 of whiskey daily. He has been a daily pot smoker since age 44 and up until now had no desire to stop admitting he wouldnt seek to improve his employment because he was going to test positive and had no intention of quitting. Recently he met a girl who has responded to him with unconditional love and wants to have a home and children with her and he realizes this can never happen if he doesnt stop drinking and drugging.Efforts to quit on his own have been unsuccessful and he believes he can have success by asssociating with others who have had success and are going thru the same things as he is to succeed.     Associated Signs/Symptoms: Depression Symptoms:  feelings of worthlessness/guilt, loss of energy/fatigue, decreased appetite, all level 1 on PHQ 9 PHQ 2 score 0 (Hypo) Manic Symptoms:  Distractibility,ADHD Anxiety Symptoms:  Agoraphobia, Excessive Worry, Panic Symptoms, Obsessive Compulsive Symptoms:   None,, Social Anxiety, Specific Phobias, Psychotic Symptoms:  Delusions, Hallucinations: only with hallucinogens Ideas of Reference, Paranoia, PTSD Symptoms: Negative  Past Medical History:  Past Medical History   Diagnosis Date  . 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 Allergy   . Substance induced mood disorder ([place]) 09/26/2015    alcohol    No past surgical history on file. Family History:  Family History  Problem Relation Age of Onset  . Adopted: Yes   Social History:   Social History   Social History  . Marital Status: Single    Spouse Name: N/A  . Number of Children: N/A  . 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 Smoking status: Current Every Day Smoker  . Smokeless tobacco: Never Used  . Alcohol Use: 20.4 oz/week    0 Standard drinks or equivalent, 14 Cans of beer, 20 Shots of liquor per week  . Drug Use: 7.00 per week    Special: Marijuana, Cocaine  . Sexual Activity: Not Asked   Other Topics Concern  . None   Social History  Narrative   Lives at home with his parents.     Works [employer]    Smokes marijuana everyday   Drinks alcohol everyday.        Additional Social History:  Pt was adpoted as an infant.He has no desire to unseal his records and is satified with his adopted parents except for his struggles with alcohol and marijuana and the problems they have caused between them.His father brought him and will continue to do so as his transmission went out and it will take about 2 weeks to repair.  Wishes his close friends from [place] such heavy drinkers  Musculoskeletal: Strength & Muscle Tone: within normal limits Gait & Station: normal Patient leans: N/A  Psychiatric Specialty Exam: HPI above  ROS ROS:  GEN/ HEENT: No fever, significant weight changes sweats headaches vision problems hearing changes, CV/ PULM; No chest pain shortness of breath cough, syncope,edema  change in exercise tolerance. GI /GU: No adominal pain, vomiting, change in bowel habits. No blood in the stool. No significant GU symptoms. SKIN/HEME: ,no acute  skin rashes suspicious lesions or bleeding. No lymphadenopathy, nodules, masses.   NEURO/ PSYCH:  negative except as per HPI  IMM/ Allergy: No unusual infections.  Allergy .    REST of 12 system review negative   There were no vitals taken for this visit.There is no weight on file to calculate BMI.  General Appearance: Fairly Groomed  Eye Contact:  Good  Speech:  Clear and Coherent and Normal Rate  Volume:  Normal  Mood:  Euthymic  Affect:  Congruent  Thought Process:  Coherent and Intact  Orientation:  Full (Time, Place, and Person)  Thought Content:  WDL and infatuated with and expreses dependency on girlfriend  Suicidal Thoughts:  No  Homicidal Thoughts:  No  Memory:  Negative  Judgement:  Impaired  Insight:  Lacking  Psychomotor Activity:  Normal  Concentration:  intact for visit  Recall:  Good  Fund of Knowledge:Fair  Language: Fair  Akathisia:  Negative  Handed:  Right  AIMS (if indicated):  NA  Assets:  Communication Skills Desire for Improvement Financial Resources/Insurance Housing Social Support Talents/Skills  ADL's:  Intact  Cognition: WNL  Sleep:  No complaint   Is the patient at risk to self?  No. Has the patient been a risk to self in the past 6 months?  No. Has the patient been a risk to self within the distant past?  No. Is the patient a risk to others?  No. Has the patient been a risk to others in the past 6 months?  No. Has the patient been a risk to others within the distant past?  No.  Allergies:  No Known Allergies Current Medications: Current Outpatient Prescriptions  Medication Sig Dispense Refill  . diphenhydrAMINE (BENADRYL) 25 mg capsule Take 25 mg by mouth every 6 (six) hours as needed. Reported on 09/24/2015    . doxycycline (VIBRA-TABS) 100 MG tablet Take 1 tablet (100 mg total) by mouth 2 (two) times daily. (Patient not taking: Reported on 09/24/2015) 14 tablet 0   No current facility-administered medications for this visit.     Previous Psychotropic Medications: Yes Prozac age 74  Substance Abuse History in the last 12 months:  Yes.    Substance Age of 1st Use Last Use Amount Specific Type  Nicotine 12 today  1 PPD  Cigs  Alcohol 18 3/18  2 beers 4 shots Tequila  Cannabis 10 3/12 1  blunt daily Smoke THC  Opiates Too many friends died     Cocaine 21 2015-10-02 2-3x/month Friends powder  Methamphetamines 4 2007 ADHD RX Ritalin  LSD;Mushrooms;Ectasy 14 12/2014 20x   Ecstasy  "   " "   Benzodiazepines 22 2015 3x-blacked out doesnt like  Caffeine      Inhalants      Others:                          Consequences of Substance Abuse: Medical Consequences:  withdrawal Legal Consequences:  NA Family Consequences:  Conflict with parents Blackouts:  Yes DT's:NA Withdrawal Symptoms:   Diaphoresis Tremors "in the middle of a shift"  Medical Decision Making:  Review and summation of old records (2), Established Problem, Worsening (2), Review of Medication Regimen & Side Effects (2) and Review of New Medication or Change in Dosage (2)   Treatment Plan/Recommendations:  Plan of Care: [place] CD IOP ([facility])  Laboratory:  UDS per protocol/ Routine per PCP  Psychotherapy: IOP Group and individual  Medications: None ;Refusese Straterra  Routine PRN Medications:  Negative  Consultations: NA    Other:  None      Darlyne Russian PA 3/20/20173:40 PM

## 2015-09-27 NOTE — Progress Notes (Signed)
    Daily Group Progress Note  Program: CD-IOP   Group Time: 1-2:30 pm  Participation Level: Active  Behavioral Response: Sharing  Type of Therapy: Process Group  Topic: Process: The first part of group was spent in process. Members checked-in and shared about the past weekend. They discussed the challenges and struggles of early recovery. Three members checked-in with new sobriety dates. The session was spent discussing these "returns to use" and identifying some of the things that members might have done to avoid the slip. There was good feedback among the group members. The program director met with the new group member during the session today and at least 2 others for med checks. Random drug tests were also collected.  Group Time: 2:45- 4pm  Participation Level: Minimal  Behavioral Response: Appropriate  Type of Therapy: Psycho-education Group  Topic: Psycho-Ed: Progression of the Addiction, Part II. The second half of group was spent in a psycho-ed. The topic was carried over from Thursday, when the [facility] chart, detailing the progression of the disease of addiction, was first introduced. Members shared their timelines and the progressive and destructive nature of their illness. Members offered their charts and offered the events of their lives as the drinking and drugging became more dominating in their lives.    Summary: The patient checked in with a new sobriety date. He admitted he and his S/O had gone downtown with friends. He had gotten very drunk on Saturday night and was also accepted some cocaine provided by a friend. The patient admitted that he planned on drinking to celebrate St. Patrick[place] Day and he displayed no remorse or hesitation in his disclosure. Another group member reminded him that he would need to change friends and find new sober people to hang out with if he had any intentions of remaining clean. This patient didn't seem too concerned or interested in this  recommendation. The patient met with the program director and did not return in time to complete his timetable in the psycho-ed. He was open and honest about his use and he is also very new to treatment and we will give him a little time to 'get with the program'. His sobriety date is 3/19.   Family Program: Family present? No   Name of family member(s):   UDS collected: No Results:   AA/NA attended?: No  Sponsor?: No   Jearl Soto, LCAS

## 2015-09-28 ENCOUNTER — Other Ambulatory Visit (HOSPITAL_COMMUNITY): Payer: BLUE CROSS/BLUE SHIELD | Admitting: Psychology

## 2015-09-28 ENCOUNTER — Encounter (HOSPITAL_COMMUNITY): Payer: Self-pay | Admitting: Psychology

## 2015-09-28 DIAGNOSIS — F1220 Cannabis dependence, uncomplicated: Secondary | ICD-10-CM

## 2015-09-28 DIAGNOSIS — F1020 Alcohol dependence, uncomplicated: Secondary | ICD-10-CM

## 2015-09-28 DIAGNOSIS — Z8659 Personal history of other mental and behavioral disorders: Secondary | ICD-10-CM

## 2015-09-28 DIAGNOSIS — F14120 Cocaine abuse with intoxication, uncomplicated: Secondary | ICD-10-CM

## 2015-09-28 LAB — ETHYL GLUCURONIDE LC/MS/MS
ETHYL GLUCURONIDE: POSITIVE — AB
ETHYL SULFATE: POSITIVE — AB
EtG/EtS LC/MS/MS: POSITIVE — AB
Ethyl Sulfate LC/MS/MS: 11400 ng/mL

## 2015-09-28 LAB — PRESCRIPTION ABUSE MONITORING 17P, URINE
6-Acetylmorphine, Urine: NEGATIVE ng/mL
Amphetamine Scrn, Ur: NEGATIVE ng/mL
BARBITURATE SCREEN URINE: NEGATIVE ng/mL
BENZODIAZEPINE SCREEN, URINE: NEGATIVE ng/mL
Buprenorphine, Urine: NEGATIVE ng/mL
Carisoprodol/Meprobamate, Ur: NEGATIVE ng/mL
Creatinine(Crt), U: 197.7 mg/dL (ref 20.0–300.0)
EDDP, Urine: NEGATIVE ng/mL
FENTANYL, URINE: NEGATIVE pg/mL
MDMA Screen, Urine: NEGATIVE ng/mL
MEPERIDINE SCREEN, URINE: NEGATIVE ng/mL
METHADONE SCREEN, URINE: NEGATIVE ng/mL
Nitrite Urine, Quantitative: NEGATIVE ug/mL
OPIATE SCREEN URINE: NEGATIVE ng/mL
OXYCODONE+OXYMORPHONE UR QL SCN: NEGATIVE ng/mL
PH UR, DRUG SCRN: 5.8 (ref 4.5–8.9)
PROPOXYPHENE SCREEN URINE: NEGATIVE ng/mL
Phencyclidine Qn, Ur: NEGATIVE ng/mL
SPECIFIC GRAVITY: 1.016
Tapentadol, Urine: NEGATIVE ng/mL
Tramadol Screen, Urine: NEGATIVE ng/mL

## 2015-09-28 LAB — CANNABINOID (GC/MS), URINE
Cannabinoid: POSITIVE — AB
Carboxy THC (GC/MS): >300 ng/mL

## 2015-09-28 LAB — ETHYL GLUCURONIDE, URINE

## 2015-09-28 LAB — COCAINE (GC/MS), URINE
BENZOYLECGONINE (GC/MS): 100000 ng/mL
COCAINE + METABOLITE: POSITIVE — AB

## 2015-09-29 ENCOUNTER — Other Ambulatory Visit (HOSPITAL_COMMUNITY): Payer: BLUE CROSS/BLUE SHIELD | Admitting: Psychology

## 2015-09-29 DIAGNOSIS — F1020 Alcohol dependence, uncomplicated: Secondary | ICD-10-CM | POA: Diagnosis not present

## 2015-09-29 DIAGNOSIS — F1220 Cannabis dependence, uncomplicated: Secondary | ICD-10-CM

## 2015-09-29 DIAGNOSIS — F14120 Cocaine abuse with intoxication, uncomplicated: Secondary | ICD-10-CM

## 2015-09-30 ENCOUNTER — Encounter (HOSPITAL_COMMUNITY): Payer: Self-pay | Admitting: Psychology

## 2015-09-30 NOTE — Progress Notes (Signed)
    Daily Group Progress Note  Program: CD-IOP   Group Time: 1-2:30 pm  Participation Level: Active  Behavioral Response: Sharing, Rationalizing and Grandiose  Type of Therapy: Process Group  Topic: Process; the first part of group was spent in process. Members shared about what they had done since we last met to support their recovery. One member had returned to use since the last group session and his slip was processed by the group. Members provided honest feedback and shared openly about their struggles. The program director met with a number of group members during the session today.   Group Time: 2:45- 4pm  Participation Level: Active  Behavioral Response: Appropriate  Type of Therapy: Psycho-education Group  Topic: Psycho-Ed: Visit with the Chaplain. The second half of group was spent in a psycho-ed with a visiting chaplain. He spoke about the benefits of mindfulness and led the group in a brief guided relaxation exercise. Upon completion of this exercise another activity was led by the chaplain. Members were asked to draw their face and the way they hope to present to the outer world. The group members then showed their drawing to their fellow group members while members offered feedback. The way they present and the way that presentation is interpreted by others, was discussed at length. The session proved revealing and informative.   Summary: The patient reported he had had a busy day and worked last night. He explained that he had only had a piece of cheesecake and a brownie. He admitted that there was no damaged steak from the restaurant for him to eat and he was hungry. The patient admitted that he doesn't usually crave sweets, but he had been eating a lot of them lately. Another member explained his lack of alcohol and the cravings for sweets. The patient reported that a male co-worker had died this past Aug 25, 2022. She had died from what appeared to be a combination of opiates  and benzos. In the psycho-ed, the patient displayed his picture and the feedback was mostly 'neutral'. The patient reported this is how he hopes to present and explained that it is less likely to cause any altercation or disagreement from others. This fellow is new to the group, but he emphasizes physical preparedness in case of violence or threats from others. He displays little empathy or compassion for others and an almost disregard for the rights of others. It remains to be seen if this young man comprehends what he is here for and how it requires an entirely different view of life. His sobriety date is 3/20.    Family Program: Family present? No   Name of family member(s):   UDS collected: No Results:   AA/NA attended?: No  Sponsor?: No   Mahira Petras, LCAS

## 2015-10-02 ENCOUNTER — Encounter (HOSPITAL_COMMUNITY): Payer: Self-pay | Admitting: Psychology
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
## 2015-10-03 ENCOUNTER — Other Ambulatory Visit (HOSPITAL_COMMUNITY): Payer: BLUE CROSS/BLUE SHIELD

## 2015-10-03 DIAGNOSIS — F14120 Cocaine abuse with intoxication, uncomplicated: Secondary | ICD-10-CM

## 2015-10-03 DIAGNOSIS — F1220 Cannabis dependence, uncomplicated: Secondary | ICD-10-CM

## 2015-10-03 DIAGNOSIS — F1020 Alcohol dependence, uncomplicated: Secondary | ICD-10-CM

## 2015-10-03 DIAGNOSIS — F17200 Nicotine dependence, unspecified, uncomplicated: Secondary | ICD-10-CM

## 2015-10-05 ENCOUNTER — Other Ambulatory Visit (HOSPITAL_COMMUNITY): Payer: BLUE CROSS/BLUE SHIELD | Admitting: Psychology

## 2015-10-05 DIAGNOSIS — F1220 Cannabis dependence, uncomplicated: Secondary | ICD-10-CM

## 2015-10-05 DIAGNOSIS — F1020 Alcohol dependence, uncomplicated: Secondary | ICD-10-CM

## 2015-10-06 ENCOUNTER — Other Ambulatory Visit (HOSPITAL_COMMUNITY): Payer: BLUE CROSS/BLUE SHIELD | Admitting: Psychology

## 2015-10-06 DIAGNOSIS — F1420 Cocaine dependence, uncomplicated: Secondary | ICD-10-CM

## 2015-10-06 DIAGNOSIS — F1220 Cannabis dependence, uncomplicated: Secondary | ICD-10-CM

## 2015-10-06 DIAGNOSIS — F1020 Alcohol dependence, uncomplicated: Secondary | ICD-10-CM | POA: Diagnosis not present

## 2015-10-07 ENCOUNTER — Encounter (HOSPITAL_COMMUNITY): Payer: Self-pay | Admitting: Psychology

## 2015-10-07 NOTE — Progress Notes (Signed)
    Daily Group Progress Note  Program: CD-IOP   Group Time: 1-2:30  Participation Level: Active  Behavioral Response: Sharing and Attention-Seeking  Type of Therapy: Process Group  Topic: Counselors met with patients for a 1.5 hour group process session. Patients were active and engaged in session. Counselor led group in discussing their thoughts, feelings, and behaviors related to their addiction and recovery. Program director met with some patients to discuss treatment and medications.      Group Time: 2:45-4  Participation Level: Active  Behavioral Response: Sharing and Attention-Seeking  Type of Therapy: Psycho-education Group  Topic: Counselors led a psycho-ed session discussing "high risk, low risk, and no risk" situations. Members discussed triggering situations and behaviors that lead them to use mind-altering drugs and alcohol. Counselors helped to link group members' experiences.    Summary: Patient lacks insight into his significant problem with drugs and alcohol. He reported that he did not attend any AA meetings since our last group. He spoke of being hypervigilant and aggressive outside of group. He said that he lives with a constant state of low level anxiety which causes him to want to argue often. Patient does not seem to a have a strong motivation to change however he does respond to counselors questions and share openly with the group. Counselor would do well to focus his behaviors and utilize behavioral interventions during these early stages of his sobriety. Patient's sobriety date is 3/25. Youlanda Roys, Intern   Family Program: Family present? No   Name of family member(s):   UDS collected: No Results: negative  AA/NA attended?: No  Sponsor?: No   Cambrie Sonnenfeld, LCAS

## 2015-10-09 ENCOUNTER — Encounter (HOSPITAL_COMMUNITY): Payer: Self-pay | Admitting: Psychology

## 2015-10-09 DIAGNOSIS — F1420 Cocaine dependence, uncomplicated: Secondary | ICD-10-CM | POA: Insufficient documentation

## 2015-10-09 NOTE — Progress Notes (Signed)
    Daily Group Progress Note  Program: CD-IOP   Group Time: 1-2:45 pm  Participation Level: Active  Behavioral Response: Sharing, Rationalizing and Grandiose  Type of Therapy: Process Group  Topic: Process: The first half of group was spent in process. Members shared about any problems, challenges or issues that have appeared in early recovery. They are also asked to identify what they have done to support their recovery. All members had remained sober, per their report, since we last met yesterday afternoon.   Group Time: 3-4 pm  Participation Level: Active  Behavioral Response: Sharing  Type of Therapy: Psycho-education Group  Topic: Psycho-ED: Identifying 'Cues' or triggers relative to 'Places, Gatherings, Things and Emotional States. A psycho-ed was presented in the second half of group. Members were provided handouts that required members to identify specific cues or triggers relative to certain settings. The handouts also included identifying strategies to implement in those circumstances to maintain one's sobriety. Near the end of the session, members shared about plans for the coming weekend and how they intended to return on Monday with the same sobriety date.    Summary: The patient reported he had attended an [place] meeting last night. 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 The patient reported he had received about 30 hugs from people at the meeting and he hadn't liked that. He admitted he would prefer a 'smaller meeting where he could hear other people's stories. In the psycho-ed, the patient reported that a big cue to using was [address] around the [employer]. He recounted all the bars and places to get high or score dope in a one mile area. The patient made some honest observations about his own drug use, he continues to miss the basic concepts around recovery, which are to be honest and make significant changes.  His sobriety date is 3/25.   Family Program: Family present? No   Name of family member(s):   UDS collected: No Results:   AA/NA attended?: YesWednesday, it was his first meeting - [address] speakers meeting  Sponsor?: No   Layth Cerezo, LCAS

## 2015-10-10 ENCOUNTER — Telehealth (HOSPITAL_COMMUNITY): Payer: Self-pay | Admitting: Psychology

## 2015-10-10 ENCOUNTER — Other Ambulatory Visit (HOSPITAL_COMMUNITY): Payer: BLUE CROSS/BLUE SHIELD

## 2015-10-12 ENCOUNTER — Other Ambulatory Visit (HOSPITAL_COMMUNITY): Payer: Self-pay | Admitting: Medical

## 2015-10-12 ENCOUNTER — Other Ambulatory Visit (HOSPITAL_COMMUNITY): Payer: BLUE CROSS/BLUE SHIELD | Attending: Medical | Admitting: Psychology

## 2015-10-12 DIAGNOSIS — F1420 Cocaine dependence, uncomplicated: Secondary | ICD-10-CM

## 2015-10-12 DIAGNOSIS — F1410 Cocaine abuse, uncomplicated: Secondary | ICD-10-CM | POA: Insufficient documentation

## 2015-10-12 DIAGNOSIS — F1020 Alcohol dependence, uncomplicated: Secondary | ICD-10-CM | POA: Diagnosis not present

## 2015-10-12 DIAGNOSIS — F1220 Cannabis dependence, uncomplicated: Secondary | ICD-10-CM | POA: Insufficient documentation

## 2015-10-13 ENCOUNTER — Other Ambulatory Visit (HOSPITAL_COMMUNITY): Payer: BLUE CROSS/BLUE SHIELD

## 2015-10-14 ENCOUNTER — Encounter (HOSPITAL_COMMUNITY): Payer: Self-pay

## 2015-10-14 NOTE — Progress Notes (Signed)
    Daily Group Progress Note  Program: CD-IOP   Group Time: 1-2:30 p  Participation Level: Active  Behavioral Response: Sharing, Grandiose and Blaming  Type of Therapy: Process Group  Topic: Process: Counselors met with group members for a process group to talk about their addiction and recovery from drugs and/or alcohol. Random drug tests were collected from 2 members. 1 member met with the program director about the possibility of starting an anti-depressant. Members discussed their cravings and challenges related to recovery. Counselor led and processed a 5 minute body scan using [email or web address].  Group Time: 2:45- 4pm  Participation Level: Minimal  Behavioral Response: Appropriate  Type of Therapy: Psycho-education Group  Topic: Process: Counselors met with group members for a process group to talk about their addiction and recovery from drugs and/or alcohol. Random drug tests were collected from 2 members. 1 member met with the program director about the possibility of starting an anti-depressant. Members discussed their cravings and challenges related to recovery. Counselor led and processed a 5 minute body scan using [email or web address].   Summary: The patient arrived late. He had left a voice mail message for me explaining his father had been late getting to his apartment. The patient missed the body scan. When asked to check-in, the patient reported his sobriety date had changed. It was today - 4/5. He reported he had been sick all day on Monday, but went to a work meeting Tuesday afternoon. Afterwards, he admitted that the bartender had offered him a free drink. The patient reminded the group that he couldn't pass up a free shot of tequila and daqquiri so he had two. The patient admitted he had wanted to drink. The counselor pointed out that there seemed to be some disappointment in his voice? The patient agreed and noted that he had gone 8 days without using. He reported that he didn't seem  able to really stop. When asked if perhaps he would benefit from entering a facility for residential treatment, he quickly negated that possibility, saying, "no way". He insisted that he had to work to pay bills and couldn't possibly go into rehab unless somebody paid all of his bills. When asked about cannabis, the patient reported he had smoked a little bit, but had not done any cocaine. This was presented as a victory of sorts. In the psycho-ed, the patient was attentive, but shared little of himself. He did serve as a family member in one of the sculptures, but offered no feedback. It seems clear this young man is not here for the right reasons and unless he can offer a good explanation of what he is willing to do to change, he will be referred for individual counseling and some of his issues around anger and other deeper issues addressed. He seems to lack some very basic social skills. The patient's new sobriety date is 4/5.    Family Program: Family present? No   Name of family member(s):   UDS collected: Yes Results: pending  AA/NA attended?: No  Sponsor?: No   [facility]

## 2015-10-17 ENCOUNTER — Other Ambulatory Visit (HOSPITAL_COMMUNITY): Payer: BLUE CROSS/BLUE SHIELD | Admitting: Psychology

## 2015-10-17 DIAGNOSIS — F1020 Alcohol dependence, uncomplicated: Secondary | ICD-10-CM

## 2015-10-17 DIAGNOSIS — F1420 Cocaine dependence, uncomplicated: Secondary | ICD-10-CM

## 2015-10-17 DIAGNOSIS — F1220 Cannabis dependence, uncomplicated: Secondary | ICD-10-CM

## 2015-10-19 ENCOUNTER — Other Ambulatory Visit (HOSPITAL_COMMUNITY): Payer: BLUE CROSS/BLUE SHIELD

## 2015-10-19 DIAGNOSIS — F1420 Cocaine dependence, uncomplicated: Secondary | ICD-10-CM

## 2015-10-19 DIAGNOSIS — F1220 Cannabis dependence, uncomplicated: Secondary | ICD-10-CM

## 2015-10-19 DIAGNOSIS — F1020 Alcohol dependence, uncomplicated: Secondary | ICD-10-CM

## 2015-10-20 ENCOUNTER — Other Ambulatory Visit (HOSPITAL_COMMUNITY): Payer: BLUE CROSS/BLUE SHIELD

## 2015-10-20 LAB — COCAINE (GC/MS), URINE
BENZOYLECGONINE (GC/MS): >5000 ng/mL
COCAINE + METABOLITE: POSITIVE — AB

## 2015-10-20 LAB — PRESCRIPTION ABUSE MONITORING 17P, URINE
6-Acetylmorphine, Urine: NEGATIVE ng/mL
Amphetamine Scrn, Ur: NEGATIVE ng/mL
BARBITURATE SCREEN URINE: NEGATIVE ng/mL
BENZODIAZEPINE SCREEN, URINE: NEGATIVE ng/mL
BUPRENORPHINE, URINE: NEGATIVE ng/mL
CREATININE(CRT), U: 307.2 mg/dL — AB (ref 20.0–300.0)
Carisoprodol/Meprobamate, Ur: NEGATIVE ng/mL
EDDP, Urine: NEGATIVE ng/mL
FENTANYL, URINE: NEGATIVE pg/mL
MDMA Screen, Urine: NEGATIVE ng/mL
MEPERIDINE SCREEN, URINE: NEGATIVE ng/mL
METHADONE SCREEN, URINE: NEGATIVE ng/mL
NITRITE URINE, QUANTITATIVE: NEGATIVE ug/mL
OPIATE SCREEN URINE: NEGATIVE ng/mL
OXYCODONE+OXYMORPHONE UR QL SCN: NEGATIVE ng/mL
PH UR, DRUG SCRN: 6.3 (ref 4.5–8.9)
PHENCYCLIDINE QUANTITATIVE URINE: NEGATIVE ng/mL
PROPOXYPHENE SCREEN URINE: NEGATIVE ng/mL
SPECIFIC GRAVITY: 1.024
TAPENTADOL, URINE: NEGATIVE ng/mL
Tramadol Screen, Urine: NEGATIVE ng/mL

## 2015-10-20 LAB — ETHYL GLUCURONIDE, URINE

## 2015-10-20 LAB — ETHYL GLUCURONIDE LC/MS/MS
ETHYL GLUCURONIDE: POSITIVE — AB
EtG/EtS LC/MS/MS: POSITIVE — AB
Ethyl Glucuronide LC/MS/MS: 662970 ng/mL
Ethyl Sulfate LC/MS/MS: 151085 ng/mL
Ethyl Sulfate: POSITIVE — AB

## 2015-10-20 LAB — CANNABINOID (GC/MS), URINE: CANNABINOID UR: POSITIVE — AB

## 2015-10-23 ENCOUNTER — Encounter (HOSPITAL_COMMUNITY): Payer: Self-pay | Admitting: Psychology

## 2015-10-23 NOTE — Progress Notes (Signed)
    Daily Group Progress Note  Program: CD-IOP   Group Time: 1-2:30  Participation Level: Active  Behavioral Response: Appropriate, Sharing and Attention-Seeking  Type of Therapy: Process Group  Topic: Counselor and Counseling intern led a group session with an emphasis on group process. Patients shared about challenges and successes related to their recovery and addiction. One new patient was present today and met with the Investment banker, operational. Another patient arrived 15 minutes late and was visibly drowsy and talking slowly. When confronted patient claimed that he was taking muscle relaxers due to allergies. Counselor escorted him out of the room to be checked by the Investment banker, operational. Random urine samples were taken from 3 patients today.  Group Time: 2:45-4  Participation Level: Active  Behavioral Response: Appropriate, Sharing and Attention-Seeking  Type of Therapy: Psycho-education Group  Topic: Counselor and Counseling intern led a group session with an emphasis on psychoeducation. One member was present for only the psychoeducation session due to a prior conflict. Counselors allowed significant time for patients to process the triggering and "uncomfortable" feelings from the earlier process session. Counselor used open questions to help patients discuss their experience of "confronting addiction with assertiveness". The counselor helped to frame the situation as a growing and learning experience.   Summary: Patient presented as active, engaged, and talkative in group today. He reported that he is working often and staying busy with friends and playing video games. When asked about how he is working on his recovery he said "sleep and finding a relaxing video game". He reported that he frequently stays up till 2 or 3AM before going to bed. He seems to be moving from early contemplation to late contemplation stage of change. He reported that he hopes to be able to "smoke or drink a little  weed and alcohol when he gets better at controlling it". Other members tried to confront and encourage him to reconsider his drug and alcohol use since he shows signs of a predisposition to alcoholism. Patient was very verbal during the process time when the group discussed the member that was asked to leave due to being impaired. The patient expressed that he felt like he was "looking at myself when I'm in denial". He seems to be growing in his insight but shows signs of significant resistance through not securing transportation, maintaining old bad habits and friends, and not engaging in positive behaviors that support his sobriety. Patient's sobriety date is   Family Program: Family present? No   Name of family member(s):   UDS collected: No Results:    AA/NA attended?: No  Sponsor?: No   Sylver Vantassell, LCAS

## 2015-10-24 ENCOUNTER — Other Ambulatory Visit (HOSPITAL_COMMUNITY): Payer: BLUE CROSS/BLUE SHIELD

## 2015-10-26 ENCOUNTER — Other Ambulatory Visit (HOSPITAL_COMMUNITY): Payer: BLUE CROSS/BLUE SHIELD

## 2015-11-09 NOTE — Progress Notes (Signed)
  [facility]   Thayer DallasJacob M Merriweather [id number]  Date of Admission: Date of Discharge:   Course of Treatment:   Goals and Activities to Help Maintain Sobriety: 1. Stay away from old friends who continue to drink and use mind-altering chemicals. 2. Continue practicing Fair Fighting rules in interpersonal conflicts. 3. Continue alcohol and drug refusal skills and call on support systems. 4.   Referrals:    Aftercare services:  1. Attend AA/NA meetings  times per week. 2. Obtain a sponsor and a home group in AA/NA. 3. Return to Psychotherapist:   Next appointment:   Plan of Action to Address Continuing Problems:     Client has NOT participated in the development of this discharge plan and has NOT received a copy of this completed plan  Maryjean MornCharles Kober  11/09/2015   [facility] CHEM 11/09/2015

## 2015-11-13 NOTE — Progress Notes (Signed)
Daily Group Progress Note  Program: CD-IOP   Group Time: 1-2:30 p  Participation Level: Active  Behavioral Response: Sharing, Grandiose and Blaming  Type of Therapy: Process Group  Topic: Process: Counselors met with group members for a process group to talk about their addiction and recovery from drugs and/or alcohol. Random drug tests were collected from 2 members. 1 member met with the program director about the possibility of starting an anti-depressant. Members discussed their cravings and challenges related to recovery. Counselor led and processed a 5 minute body scan using [email or web address].  Group Time: 2:45- 4pm  Participation Level: Minimal  Behavioral Response: Appropriate  Type of Therapy: Psycho-education Group  Topic: Process: Counselors met with group members for a process group to talk about their addiction and recovery from drugs and/or alcohol. Random drug tests were collected from 2 members. 1 member met with the program director about the possibility of starting an anti-depressant. Members discussed their cravings and challenges related to recovery. Counselor led and processed a 5 minute body scan using [email or web address].   Summary: The patient arrived late. He had left a voice mail message for me explaining his father had been late getting to his apartment. The patient missed the body scan. When asked to check-in, the patient reported his sobriety date had changed. It was today - 4/5. He reported he had been sick all day on Monday, but went to a work meeting Tuesday afternoon. Afterwards, he admitted that the bartender had offered him a free drink. The patient reminded the group that he couldn't pass up a free shot of tequila and daqquiri so he had two. The patient admitted he had wanted to drink. The counselor pointed out that there seemed to be some disappointment in his voice? The patient agreed and noted that he had gone 8 days without using. He reported that he didn't seem  able to really stop. When asked if perhaps he would benefit from entering a facility for residential treatment, he quickly negated that possibility, saying, "no way". He insisted that he had to work to pay bills and couldn't possibly go into rehab unless somebody paid all of his bills. When asked about cannabis, the patient reported he had smoked a little bit, but had not done any cocaine. This was presented as a victory of sorts. In the psycho-ed, the patient was attentive, but shared little of himself. He did serve as a family member in one of the sculptures, but offered no feedback. It seems clear this young man is not here for the right reasons and unless he can offer a good explanation of what he is willing to do to change, he will be referred for individual counseling and some of his issues around anger and other deeper issues addressed. He seems to lack some very basic social skills. The patient's new sobriety date is 4/5.    Family Program: Family present? No   Name of family member(s):   UDS collected: Yes Results: pending  AA/NA attended?: No  Sponsor?: No   Darlyne Russian, PA-C          Daily Group Progress Note  Program: CD-IOP   Group Time: 1-2:45 pm  Participation Level: Active  Behavioral Response: Sharing, Rationalizing and Grandiose  Type of Therapy: Process Group  Topic: Process: The first half of group was spent in process. Members shared about any problems, challenges or issues that have appeared in early recovery. They are also asked to identify what they have done  to support their recovery. All members had remained sober, per their report, since we last met yesterday afternoon.   Group Time: 3-4 pm  Participation Level: Active  Behavioral Response: Sharing  Type of Therapy: Psycho-education Group  Topic: Psycho-ED: Identifying 'Cues' or triggers relative to 'Places, Gatherings, Things and Emotional States. A psycho-ed was presented in the second half  of group. Members were provided handouts that required members to identify specific cues or triggers relative to certain settings. The handouts also included identifying strategies to implement in those circumstances to maintain one's sobriety. Near the end of the session, members shared about plans for the coming weekend and how they intended to return on Monday with the same sobriety date.    Summary: The patient reported he had attended an [employer] meeting last night. 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 The patient reported he had received about 30 hugs from people at the meeting and he hadn't liked that. He admitted he would prefer a 'smaller meeting where he could hear other people's stories. In the psycho-ed, the patient reported that a big cue to using was [address] around the [employer]. He recounted all the bars and places to get high or score dope in a one mile area. The patient made some honest observations about his own drug use, he continues to miss the basic concepts around recovery, which are to be honest and make significant changes. His sobriety date is 3/25.   Family Program: Family present? No   Name of family member(s):   UDS collected: No Results:   AA/NA attended?: YesWednesday, it was his first meeting - [address] speakers meeting  Sponsor?: No   Darlyne Russian, PA-C

## 2015-11-14 ENCOUNTER — Encounter (HOSPITAL_COMMUNITY): Payer: Self-pay

## 2015-11-14 NOTE — Progress Notes (Unsigned)
    Daily Group Progress Note  Program: CD-IOP   Group Time: 1-2:30 pm  Participation Level: Active  Behavioral Response: Sharing  Type of Therapy: Process Group  Topic: Process: the first half of group was spent in process. Two new group members were present. Members shared about the events over the past weekend and what actions they had taken to support their recovery. At least 3 group members had 'returned to use' since they last appeared for group. One group member felt tremendous remorse and the group spent time working with him and identifying strategies that could be employed should he be tempted again. Any suggestions of ambivalence were addressed using techniques from motivational interviewing. The program director met with one of the new members and drug tests were collected from 3 group members.   Group Time: 2:45-4pm  Participation Level: Active  Behavioral Response: Sharing  Type of Therapy: Psycho-education Group  Topic: Process/Psycho-Ed: Values. The second half of group began with the new group members introducing themselves and sharing about their past and what they might need from this program. After they had been introduced and fielded questions from other members, a brief psycho-ed on the topic of 'Values' was provided. Members were invited to share some of their values and how their behaviors reflect those values. Members seemed confused and struggled to identify their values. This session came to an end before this topic could be explored fully. This subject will be continued in session later.   Summary:  The patient checked-in with a new sobriety date. When asked to explain what happened this past weekend, the patient reported, "I really fucked up". He stated that after work on Friday, he had been very frustrated and angry and had 2 shots at the bar and a few bumps of coke offered by a co-worker. When asked about meetings, he admitted that he had not attended any  12-step meetings and proceeded to make several excuses as to why he had not been to any meetings. During the second half of group and the psycho-ed, the patient was challenged to identify his values. He struggled to identify what he really believed and every time he identified something, he then quickly countered. Another member asked if he valued loyalty? The patient reported he did and felt like he had some pretty loyal friends. But when the counselor asked about family loyalty, the patient scoffed at this notion and used profanity to describe his feelings about his siblings and parents. This patient continues to display a hostile extremely critical attitude towards almost every boyd and everything. The idea of humility or gratitude - the two hallmarks of recovery - appear far from his understanding. It is unclear if this young man is benefiting or possibly negating the therapeutic benefits of group counseling. His sobriety date is now 3/25.    Family Program: Family present? No   Name of family member(s):   UDS collected: No Results: patient had self-disclosed drug use.   AA/NA attended?: No  Sponsor?: No   [facility] CHEM

## 2015-11-15 ENCOUNTER — Encounter (HOSPITAL_COMMUNITY): Payer: Self-pay

## 2015-11-15 ENCOUNTER — Encounter (HOSPITAL_COMMUNITY): Payer: Self-pay | Admitting: Psychology

## 2015-11-15 NOTE — Progress Notes (Signed)
    Daily Group Progress Note  Program: CD-IOP   Group Time: 1-2:30 pm  Participation Level: Active  Behavioral Response: Sharing, Rationalizing and Disruptive  Type of Therapy: Process Group  Topic: Process: the first part of group was spent in process.  Members shared bout the past weekend and any challenges or temptations in early recovery.  They were invited to discuss any realizations or insights they had gleaned since the last session met. One member shared that he had drunk over the weekend. He was filled with guilt and remorse and shared openly about the events leading up to the actual use. He received good feedback from his fellow group members. The group was small today with 3 members absent for various reasons. The program director met to discuss medications with at least one group member.   Group Time: 2:45-4 pm  Participation Level: Active  Behavioral Response: Sharing  Type of Therapy: Psycho-education Group  Topic: Psycho-Ed: The Wheel of Life. The second half of group was spent in a psycho-ed on the Wheel of Life. A handout was provided with a circle segmented into 8 different sections. Each of the 8 segments represented different parts of life. Members were asked to place their satisfaction with each segment onto the wheel. The satisfaction with each segment was identified by how far from the center or hub each segment was drawn. Members then drew their wheels on the board and explained them to the rest of the group. The session required more activity than others and members seemed more energized by getting up, drawing their wheels and then reporting to the group.   Summary: The patient reported he had worked all weekend. But he was quick to point out to the group that he had not gone out and partied with friends either. For him, this seemed to represent a victory of sorts. The patient made desparaging remarks about his parents, especially his mother, whom he refers to as  "a fucking bitch" much of the time. It clearly makes some of the other group members frustrated to hear this fellow talk so poorly about his parents when they seem to be doing everything for him. One member mentioned the importance of gratitude in recovery and this patient indicated that his parents should be happy, "I am doing this thing". In the psycho-ed, the patient's wheel was pretty uneven and he admitted that his job doesn't bring in much money. The patient reminded the group that he had worked there for 4 years and had much more seniority than anybody else. The counselor pointed out that this was something he took pride in and the patient confirmed he knows pretty much how the place operates. Regarding his personal relationships, the patient made some unkind comments about his girlfriend, but indicated he was happy with her and their apartment. The patient continues to display a variety of perspectives that seem to depend on the day. He was reminded that attending 12-step meetings is required to be in this program. The Monday evening AA meeting at [facility] is not far from his apartment and he was encouraged to attend this meeting. He reported he would look it up and ask his dad to take him there. The patient's degree of motivation is clearly lacking. His sobriety date remains 4/4.   Family Program: No   Name of family member(s)  UDS collected: No Results:   AA/NA attended?: No  Sponsor?: No   Mairen Wallenstein, LCAS

## 2016-02-24 ENCOUNTER — Encounter: Payer: Self-pay | Admitting: Family Medicine

## 2016-02-24 ENCOUNTER — Ambulatory Visit (INDEPENDENT_AMBULATORY_CARE_PROVIDER_SITE_OTHER): Payer: BLUE CROSS/BLUE SHIELD | Admitting: Family Medicine

## 2016-02-24 VITALS — BP 92/60 | HR 86 | Temp 97.5°F | Ht 73.0 in | Wt 130.9 lb

## 2016-02-24 DIAGNOSIS — L02412 Cutaneous abscess of left axilla: Secondary | ICD-10-CM | POA: Diagnosis not present

## 2016-02-24 MED ORDER — DOXYCYCLINE HYCLATE 100 MG PO CAPS: 100.0000 mg | ORAL_CAPSULE | Freq: Two times a day (BID) | ORAL | 0 refills | Status: AC

## 2016-02-24 NOTE — Progress Notes (Signed)
Pre visit review using our clinic review tool, if applicable. No additional management support is needed unless otherwise documented below in the visit note. 

## 2016-02-24 NOTE — Patient Instructions (Signed)
Warm compresses several times daily Follow up in 3-4 days if not resolving.

## 2016-02-24 NOTE — Progress Notes (Signed)
Subjective:     Patient ID: Glen Wilson, male   DOB: May 05, 1991, 25 y.o.   MRN: [id number]  HPI Left axillary abscess. He noticed some soreness few days ago. Yesterday  spontaneously ruptured. Denies prior history of MRSA or other abscess. No known drug allergies. Less pain and swelling today following rupture No fever or chills. Continues to drain some today.  Past Medical History:  Diagnosis Date  . Allergy   . 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 Substance induced mood disorder (HCC) 09/26/2015   alcohol    No past surgical history on file.  reports that he has been smoking Cigarettes.  He has a 10.00 pack-year smoking history. He has never used smokeless tobacco. He reports that he drinks about 20.4 oz of alcohol per week . He reports that he uses drugs, including Marijuana and Cocaine, about 7 times per week. He was adopted. Family history is unknown by patient. No Known Allergies   Review of Systems  Constitutional: Negative for chills and fever.       Objective:   Physical Exam  Constitutional: He appears well-developed and well-nourished. No distress.  Cardiovascular: Normal rate and regular rhythm.   Pulmonary/Chest: Effort normal and breath sounds normal. No respiratory distress. He has no wheezes. He has no rales.  Skin:  Left axillary region proximally 4 x 5 cm area of erythema. Near center he has some induration and spontaneous drainage of pus. Moderately tender       Assessment:     Left axillary abscess with cellulitis changes. Spontaneously draining    Plan:     -We discussed possible I&D but since is already draining we decided to stick with warm compresses and start antibiotic with doxycycline 100 mg twice a day for 10 days -Follow-up by Monday if not substantially improved    Kristian CoveyBruce W Burchette MD [facility]

## 2016-04-23 ENCOUNTER — Telehealth: Payer: Self-pay | Admitting: Internal Medicine

## 2016-05-04 NOTE — Telephone Encounter (Signed)
Error
